# Patient Record
Sex: Female | Born: 1960 | Race: White | Hispanic: No | Marital: Married | Smoking: Never smoker
Health system: Southern US, Community
[De-identification: ages and names within clinical notes are randomized; demographics above are authoritative.]

## PROBLEM LIST (undated history)

## (undated) DIAGNOSIS — R5383 Other fatigue: Secondary | ICD-10-CM

## (undated) DIAGNOSIS — M797 Fibromyalgia: Secondary | ICD-10-CM

## (undated) DIAGNOSIS — G4700 Insomnia, unspecified: Secondary | ICD-10-CM

## (undated) DIAGNOSIS — M26649 Arthritis of unspecified temporomandibular joint: Secondary | ICD-10-CM

## (undated) DIAGNOSIS — N390 Urinary tract infection, site not specified: Secondary | ICD-10-CM

## (undated) DIAGNOSIS — N63 Unspecified lump in unspecified breast: Secondary | ICD-10-CM

## (undated) DIAGNOSIS — Z8041 Family history of malignant neoplasm of ovary: Secondary | ICD-10-CM

## (undated) DIAGNOSIS — J329 Chronic sinusitis, unspecified: Secondary | ICD-10-CM

## (undated) DIAGNOSIS — E559 Vitamin D deficiency, unspecified: Secondary | ICD-10-CM

## (undated) DIAGNOSIS — R635 Abnormal weight gain: Secondary | ICD-10-CM

## (undated) DIAGNOSIS — R519 Headache, unspecified: Secondary | ICD-10-CM

## (undated) DIAGNOSIS — R350 Frequency of micturition: Secondary | ICD-10-CM

## (undated) DIAGNOSIS — M1990 Unspecified osteoarthritis, unspecified site: Secondary | ICD-10-CM

## (undated) DIAGNOSIS — R0683 Snoring: Secondary | ICD-10-CM

## (undated) DIAGNOSIS — F32A Depression, unspecified: Secondary | ICD-10-CM

## (undated) DIAGNOSIS — M7060 Trochanteric bursitis, unspecified hip: Secondary | ICD-10-CM

## (undated) DIAGNOSIS — N926 Irregular menstruation, unspecified: Secondary | ICD-10-CM

## (undated) DIAGNOSIS — R102 Pelvic and perineal pain: Secondary | ICD-10-CM

## (undated) DIAGNOSIS — H6120 Impacted cerumen, unspecified ear: Secondary | ICD-10-CM

## (undated) DIAGNOSIS — F4322 Adjustment disorder with anxiety: Secondary | ICD-10-CM

## (undated) DIAGNOSIS — G43909 Migraine, unspecified, not intractable, without status migrainosus: Secondary | ICD-10-CM

## (undated) DIAGNOSIS — Z1371 Encounter for nonprocreative screening for genetic disease carrier status: Secondary | ICD-10-CM

## (undated) DIAGNOSIS — F329 Major depressive disorder, single episode, unspecified: Secondary | ICD-10-CM

## (undated) DIAGNOSIS — R51 Headache: Secondary | ICD-10-CM

## (undated) DIAGNOSIS — M549 Dorsalgia, unspecified: Secondary | ICD-10-CM

## (undated) DIAGNOSIS — R21 Rash and other nonspecific skin eruption: Secondary | ICD-10-CM

## (undated) DIAGNOSIS — N6019 Diffuse cystic mastopathy of unspecified breast: Secondary | ICD-10-CM

## (undated) DIAGNOSIS — M2669 Other specified disorders of temporomandibular joint: Secondary | ICD-10-CM

## (undated) DIAGNOSIS — E282 Polycystic ovarian syndrome: Secondary | ICD-10-CM

## (undated) HISTORY — DX: Migraine, unspecified, not intractable, without status migrainosus: G43.909

## (undated) HISTORY — DX: Depression, unspecified: F32.A

## (undated) HISTORY — DX: Unspecified osteoarthritis, unspecified site: M19.90

## (undated) HISTORY — DX: Arthritis of unspecified temporomandibular joint: M26.649

## (undated) HISTORY — DX: Trochanteric bursitis, unspecified hip: M70.60

## (undated) HISTORY — DX: Headache, unspecified: R51.9

## (undated) HISTORY — DX: Frequency of micturition: R35.0

## (undated) HISTORY — DX: Urinary tract infection, site not specified: N39.0

## (undated) HISTORY — DX: Other specified disorders of temporomandibular joint: M26.69

## (undated) HISTORY — DX: Pelvic and perineal pain: R10.2

## (undated) HISTORY — DX: Chronic sinusitis, unspecified: J32.9

## (undated) HISTORY — DX: Polycystic ovarian syndrome: E28.2

## (undated) HISTORY — DX: Unspecified lump in unspecified breast: N63.0

## (undated) HISTORY — DX: Snoring: R06.83

## (undated) HISTORY — DX: Encounter for nonprocreative screening for genetic disease carrier status: Z13.71

## (undated) HISTORY — DX: Rash and other nonspecific skin eruption: R21

## (undated) HISTORY — DX: Dorsalgia, unspecified: M54.9

## (undated) HISTORY — DX: Adjustment disorder with anxiety: F43.22

## (undated) HISTORY — DX: Family history of malignant neoplasm of ovary: Z80.41

## (undated) HISTORY — DX: Headache: R51

## (undated) HISTORY — DX: Insomnia, unspecified: G47.00

## (undated) HISTORY — DX: Vitamin D deficiency, unspecified: E55.9

## (undated) HISTORY — DX: Irregular menstruation, unspecified: N92.6

## (undated) HISTORY — DX: Major depressive disorder, single episode, unspecified: F32.9

## (undated) HISTORY — DX: Fibromyalgia: M79.7

## (undated) HISTORY — DX: Abnormal weight gain: R63.5

## (undated) HISTORY — DX: Other fatigue: R53.83

## (undated) HISTORY — DX: Diffuse cystic mastopathy of unspecified breast: N60.19

## (undated) HISTORY — DX: Impacted cerumen, unspecified ear: H61.20

---

## 2004-06-19 ENCOUNTER — Ambulatory Visit: Payer: Self-pay

## 2005-07-22 ENCOUNTER — Ambulatory Visit: Payer: Self-pay

## 2005-08-07 ENCOUNTER — Ambulatory Visit: Payer: Self-pay

## 2006-09-04 ENCOUNTER — Ambulatory Visit: Payer: Self-pay

## 2007-08-20 HISTORY — PX: SHOULDER SURGERY: SHX246

## 2007-09-11 ENCOUNTER — Ambulatory Visit: Payer: Self-pay

## 2007-09-16 ENCOUNTER — Ambulatory Visit: Payer: Self-pay | Admitting: Specialist

## 2007-09-23 ENCOUNTER — Ambulatory Visit: Payer: Self-pay | Admitting: Specialist

## 2008-03-08 ENCOUNTER — Ambulatory Visit: Payer: Self-pay | Admitting: Specialist

## 2008-09-28 ENCOUNTER — Ambulatory Visit: Payer: Self-pay | Admitting: Family Medicine

## 2009-11-27 ENCOUNTER — Ambulatory Visit: Payer: Self-pay

## 2009-12-25 ENCOUNTER — Ambulatory Visit: Payer: Self-pay | Admitting: Gastroenterology

## 2010-08-19 HISTORY — PX: OTHER SURGICAL HISTORY: SHX169

## 2010-12-13 ENCOUNTER — Ambulatory Visit: Payer: Self-pay | Admitting: Specialist

## 2011-01-03 ENCOUNTER — Ambulatory Visit: Payer: Self-pay | Admitting: Family Medicine

## 2011-03-13 ENCOUNTER — Other Ambulatory Visit: Payer: Self-pay | Admitting: Neurosurgery

## 2011-03-13 ENCOUNTER — Ambulatory Visit
Admission: RE | Admit: 2011-03-13 | Discharge: 2011-03-13 | Disposition: A | Discharge status: Home / Self Care | Payer: 59 | Source: Ambulatory Visit | Attending: Neurosurgery | Admitting: Neurosurgery

## 2011-03-13 DIAGNOSIS — M542 Cervicalgia: Secondary | ICD-10-CM

## 2011-05-31 ENCOUNTER — Ambulatory Visit: Payer: Self-pay | Admitting: Specialist

## 2011-07-24 ENCOUNTER — Ambulatory Visit: Payer: Self-pay

## 2012-06-25 ENCOUNTER — Ambulatory Visit: Payer: Self-pay | Admitting: Family Medicine

## 2013-01-12 ENCOUNTER — Other Ambulatory Visit: Payer: Self-pay | Admitting: Neurosurgery

## 2013-01-12 DIAGNOSIS — M5412 Radiculopathy, cervical region: Secondary | ICD-10-CM

## 2013-01-18 ENCOUNTER — Ambulatory Visit
Admission: RE | Admit: 2013-01-18 | Discharge: 2013-01-18 | Disposition: A | Discharge status: Home / Self Care | Payer: 59 | Source: Ambulatory Visit | Attending: Neurosurgery | Admitting: Neurosurgery

## 2013-01-18 DIAGNOSIS — M5412 Radiculopathy, cervical region: Secondary | ICD-10-CM

## 2013-02-10 ENCOUNTER — Other Ambulatory Visit: Payer: Self-pay | Admitting: Neurosurgery

## 2013-02-10 DIAGNOSIS — M5020 Other cervical disc displacement, unspecified cervical region: Secondary | ICD-10-CM

## 2013-02-12 ENCOUNTER — Ambulatory Visit
Admission: RE | Admit: 2013-02-12 | Discharge: 2013-02-12 | Disposition: A | Discharge status: Home / Self Care | Payer: 59 | Source: Ambulatory Visit | Attending: Neurosurgery | Admitting: Neurosurgery

## 2013-02-12 VITALS — BP 118/63 | HR 77

## 2013-02-12 DIAGNOSIS — M5020 Other cervical disc displacement, unspecified cervical region: Secondary | ICD-10-CM

## 2013-02-12 MED ORDER — IOHEXOL 300 MG/ML  SOLN
1.0000 mL | Freq: Once | INTRAMUSCULAR | Status: AC | PRN
  Administered 2013-02-12: 1 mL via EPIDURAL

## 2013-02-12 MED ORDER — TRIAMCINOLONE ACETONIDE 40 MG/ML IJ SUSP (RADIOLOGY)
60.0000 mg | Freq: Once | INTRAMUSCULAR | Status: AC
  Administered 2013-02-12: 60 mg via EPIDURAL

## 2013-07-06 ENCOUNTER — Ambulatory Visit: Payer: Self-pay | Admitting: Family Medicine

## 2013-07-23 ENCOUNTER — Telehealth: Payer: Self-pay | Admitting: Obstetrics & Gynecology

## 2013-07-28 ENCOUNTER — Ambulatory Visit: Payer: Self-pay | Admitting: Family Medicine

## 2014-06-03 ENCOUNTER — Other Ambulatory Visit: Payer: Self-pay

## 2014-09-22 ENCOUNTER — Ambulatory Visit: Payer: Self-pay | Admitting: Specialist

## 2014-11-10 DIAGNOSIS — Z8739 Personal history of other diseases of the musculoskeletal system and connective tissue: Secondary | ICD-10-CM | POA: Insufficient documentation

## 2015-02-22 ENCOUNTER — Other Ambulatory Visit: Payer: Self-pay

## 2015-02-22 DIAGNOSIS — M549 Dorsalgia, unspecified: Secondary | ICD-10-CM | POA: Insufficient documentation

## 2015-02-22 MED ORDER — MELOXICAM 15 MG PO TABS: 15.0000 mg | ORAL_TABLET | Freq: Every day | ORAL | Status: DC

## 2015-05-17 ENCOUNTER — Encounter: Payer: Self-pay | Admitting: *Deleted

## 2015-05-22 ENCOUNTER — Encounter: Payer: Self-pay | Admitting: Urology

## 2015-05-22 ENCOUNTER — Ambulatory Visit (INDEPENDENT_AMBULATORY_CARE_PROVIDER_SITE_OTHER): Payer: 59 | Admitting: Urology

## 2015-05-22 VITALS — BP 101/69 | HR 97 | Ht 64.0 in | Wt 180.2 lb

## 2015-05-22 DIAGNOSIS — N3946 Mixed incontinence: Secondary | ICD-10-CM

## 2015-05-22 DIAGNOSIS — R350 Frequency of micturition: Secondary | ICD-10-CM

## 2015-05-22 DIAGNOSIS — N390 Urinary tract infection, site not specified: Secondary | ICD-10-CM

## 2015-05-22 LAB — MICROSCOPIC EXAMINATION: RBC, UA: NONE SEEN /hpf (ref 0–?)

## 2015-05-22 LAB — URINALYSIS, COMPLETE
Bilirubin, UA: NEGATIVE
GLUCOSE, UA: NEGATIVE
LEUKOCYTES UA: NEGATIVE
NITRITE UA: NEGATIVE
Protein, UA: NEGATIVE
RBC, UA: NEGATIVE
Urobilinogen, Ur: 0.2 mg/dL (ref 0.2–1.0)
pH, UA: 5.5 (ref 5.0–7.5)

## 2015-05-22 LAB — BLADDER SCAN AMB NON-IMAGING: Scan Result: 0

## 2015-05-22 MED ORDER — ESTROGENS, CONJUGATED 0.625 MG/GM VA CREA: 1.0000 | TOPICAL_CREAM | Freq: Every day | VAGINAL | Status: DC

## 2015-05-22 MED ORDER — MIRABEGRON ER 25 MG PO TB24: 25.0000 mg | ORAL_TABLET | Freq: Every day | ORAL | Status: DC

## 2015-05-22 NOTE — Progress Notes (Signed)
05/22/2015 9:37 AM   Kayla English August 02, 1961 [id number]  Referring provider: No referring provider defined for this encounter.  Chief Complaint  Patient presents with  . Urinary Frequency    one year recheck  . Urinary Tract Infection    HPI: Patient is a 54 year old white female with a history of recurrent urinary tract infections, mixed incontinence and urinary frequency who presents today for her yearly follow-up.  Today, patient is complaining of urinary urgency, frequency and pain at the end of urination.  She states she feels like something "plugs" her up and then she finishes voiding.  She does not complain of dysuria, gross hematuria or suprapubic pain.    She is also having mixed urinary incontinence.  She also complains of dyspareunia of a frictional nature.    She was on Myrbetriq 25 mg daily 1 year ago and she states she did help with the urgency and frequency, but she cannot remember if it controlled the pain at the end of urination.  She states she only took the Myrbetriq for 2 months, but she did not call for a refill.  PMH: Past Medical History  Diagnosis Date  . Trochanteric bursitis   . Pelvic pain in female   . Weight gain   . Fatigue   . Sinusitis   . Insomnia   . Headache   . Avitaminosis D   . TMJ arthritis   . Snoring   . Rash   . Transient adjustment reaction with anxiety   . Cerumen impaction   . Migraine   . Polycystic ovaries   . Breast nodule   . Back pain   . Irregular menses   . Osteoarthritis   . Depression   . Fibromyalgia   . Fibrocystic breast   . Pelvic pain in female   . Urinary frequency   . UTI (lower urinary tract infection)     Surgical History: Past Surgical History  Procedure Laterality Date  . Neck disc sugery  2012  . Shoulder surgery  2009  . Cesarean section  1977    Home Medications:    Medication List       This list is accurate as of: 05/22/15  9:37 AM.  Always use your most recent med list.               acetaminophen 325 MG tablet  Commonly known as:  TYLENOL  Take by mouth.     buPROPion 150 MG 24 hr tablet  Commonly known as:  WELLBUTRIN XL  Take by mouth.     calcium-vitamin D 500-200 MG-UNIT tablet  Take by mouth.     conjugated estrogens vaginal cream  Commonly known as:  PREMARIN  Place 1 Applicatorful vaginally daily. Apply 0.5mg  (pea-sized amount)  just inside the vaginal introitus with a finger-tip every night for two weeks and then Monday, Wednesday and Friday nights.     DULoxetine 60 MG capsule  Commonly known as:  CYMBALTA  Take by mouth.     ibuprofen 400 MG tablet  Commonly known as:  ADVIL,MOTRIN  Take 400 mg by mouth every 6 (six) hours as needed.     isometheptene-acetaminophen-dichloralphenazone 65-100-325 MG capsule  Commonly known as:  MIDRIN  Take by mouth.     meloxicam 15 MG tablet  Commonly known as:  MOBIC  Take 1 tablet (15 mg total) by mouth daily. as needed     mirabegron ER 25 MG Tb24 tablet  Commonly known as:  MYRBETRIQ  Take 1 tablet (25 mg total) by mouth daily.     MULTI-VITAMINS Tabs  Take by mouth.     PROBIOTIC & ACIDOPHILUS EX ST PO  Take by mouth.     sertraline 100 MG tablet  Commonly known as:  ZOLOFT     topiramate 100 MG tablet  Commonly known as:  TOPAMAX  Take by mouth.     Vitamin D (Cholecalciferol) 1000 UNITS Caps  Take by mouth.        Allergies: No Known Allergies  Family History: Family History  Problem Relation Age of Onset  . Kidney disease Neg Hx   . Bladder Cancer Neg Hx   . Prostate cancer Neg Hx     Social History:  reports that she has never smoked. She does not have any smokeless tobacco history on file. She reports that she does not drink alcohol or use illicit drugs.  ROS: UROLOGY Frequent Urination?: No Hard to postpone urination?: Yes Burning/pain with urination?: No Get up at night to urinate?: No Leakage of urine?: Yes Urine stream starts and stops?: No Trouble  starting stream?: No Do you have to strain to urinate?: No Blood in urine?: No Urinary tract infection?: No Sexually transmitted disease?: No Injury to kidneys or bladder?: No Painful intercourse?: Yes Weak stream?: No Currently pregnant?: No Vaginal bleeding?: No Last menstrual period?: n  Gastrointestinal Nausea?: No Vomiting?: No Indigestion/heartburn?: No Diarrhea?: No Constipation?: No  Constitutional Fever: No Night sweats?: No Weight loss?: No Fatigue?: Yes  Skin Skin rash/lesions?: No Itching?: No  Eyes Blurred vision?: Yes Double vision?: No  Ears/Nose/Throat Sore throat?: No Sinus problems?: No  Hematologic/Lymphatic Swollen glands?: No Easy bruising?: Yes  Cardiovascular Leg swelling?: No Chest pain?: No  Respiratory Cough?: No Shortness of breath?: No  Endocrine Excessive thirst?: No  Musculoskeletal Back pain?: Yes Joint pain?: Yes  Neurological Headaches?: Yes Dizziness?: Yes  Psychologic Depression?: No Anxiety?: Yes  Physical Exam: BP 101/69 mmHg  Pulse 97  Ht  (1.626 m)  Wt 180 lb 3.2 oz (81.738 kg)  BMI 30.92 kg/m2  GU:  Atrophic external genitalia.  Normal urethral meatus. No urethral masses and/or tenderness. Urethral hypermobility noted, but no leakage demonstrated.  No bladder fullness or masses. Grade I cystocele.  No vaginal lesions or discharge. Normal rectal tone, no masses. Normal anus and perineum.   Laboratory Data:  Urinalysis Results for orders placed or performed in visit on 05/22/15  Microscopic Examination  Result Value Ref Range   WBC, UA 6-10 (A) 0 -  5 /hpf   RBC, UA None seen 0 -  2 /hpf   Epithelial Cells (non renal) 0-10 0 - 10 /hpf   Mucus, UA Present (A) Not Estab.   Bacteria, UA Many (A) None seen/Few  Urinalysis, Complete  Result Value Ref Range   Specific Gravity, UA >1.030 (H) 1.005 - 1.030   pH, UA 5.5 5.0 - 7.5   Color, UA Yellow Yellow   Appearance Ur Clear Clear    Leukocytes, UA Negative Negative   Protein, UA Negative Negative/Trace   Glucose, UA Negative Negative   Ketones, UA Trace (A) Negative   RBC, UA Negative Negative   Bilirubin, UA Negative Negative   Urobilinogen, Ur 0.2 0.2 - 1.0 mg/dL   Nitrite, UA Negative Negative   Microscopic Examination See below:   BLADDER SCAN AMB NON-IMAGING  Result Value Ref Range   Scan Result 0      Pertinent Imaging: Results  for LEIGHANN, AMADON (MRN [id number]) as of 05/22/2015 09:02  Ref. Range 05/22/2015 08:57  Scan Result Unknown 0    Assessment & Plan:    1. Mixed incontinence:   Patient is unsure if the Myrbetriq was helpful with her mixed urinary incontinence.  She would like to try the medication again. I have given her Myrbetriq 25 mg samples (#28) and will have her return in 3 weeks for a symptom recheck and PVR.  2. Urinary frequency:   Patient found  Myrbetriq  25 mg helpful with the urinary frequency. She would like to restart this medication.   She will return in 3 weeks for symptom recheck and PVR.  3. Recurrent UTI:    Patient does not report any urinary tract infections over the last year.  We will continue to monitor.  4. Atrophic vaginitis:   Patient was given a sample of vaginal estrogen cream (PREMARIN) and instructed to apply 0.5mg  (pea-sized amount)  just inside the vaginal introitus with a finger-tip every night for two weeks and then Monday, Wednesday and Friday nights.  I explained to the patient that vaginally administered estrogen, which causes only a slight increase in the blood estrogen levels, have fewer contraindications and adverse systemic effects that oral HT.   We will recheck her vaginal mucosa and see if her dyspareunia improves.    Return in about 3 weeks (around 06/12/2015) for PVR.  Michiel Cowboy, PA-C  [facility] [address] [place], [place] [address] [phone]

## 2015-05-22 NOTE — Patient Instructions (Signed)
Patient was given a sample of vaginal estrogen cream and instructed to apply 0.5mg (pea-sized amount)  just inside the vaginal introitus with a finger-tip every night for two weeks and then Monday, Wednesday and Friday nights.   

## 2015-05-31 ENCOUNTER — Telehealth: Payer: Self-pay

## 2015-05-31 NOTE — Telephone Encounter (Signed)
Myrbetriq approved via PA.

## 2015-06-01 ENCOUNTER — Other Ambulatory Visit: Payer: Self-pay

## 2015-06-01 DIAGNOSIS — F329 Major depressive disorder, single episode, unspecified: Secondary | ICD-10-CM

## 2015-06-01 DIAGNOSIS — F32A Depression, unspecified: Secondary | ICD-10-CM

## 2015-06-01 MED ORDER — SERTRALINE HCL 100 MG PO TABS: 100.0000 mg | ORAL_TABLET | Freq: Every day | ORAL | Status: DC

## 2015-06-01 NOTE — Telephone Encounter (Signed)
Pharmacy requesting refill. Last OV was 08/02/14.

## 2015-06-13 ENCOUNTER — Ambulatory Visit: Payer: 59 | Admitting: Urology

## 2015-10-04 ENCOUNTER — Other Ambulatory Visit: Payer: Self-pay

## 2015-10-04 DIAGNOSIS — F32A Depression, unspecified: Secondary | ICD-10-CM | POA: Insufficient documentation

## 2015-10-04 DIAGNOSIS — F329 Major depressive disorder, single episode, unspecified: Secondary | ICD-10-CM

## 2015-10-04 MED ORDER — DULOXETINE HCL 60 MG PO CPEP: 60.0000 mg | ORAL_CAPSULE | Freq: Every day | ORAL | Status: DC

## 2015-10-04 NOTE — Telephone Encounter (Signed)
Pharmacy requesting refill. sd 

## 2015-12-18 ENCOUNTER — Other Ambulatory Visit: Payer: Self-pay | Admitting: Family Medicine

## 2015-12-18 DIAGNOSIS — F32A Depression, unspecified: Secondary | ICD-10-CM

## 2015-12-18 DIAGNOSIS — F329 Major depressive disorder, single episode, unspecified: Secondary | ICD-10-CM

## 2016-01-29 ENCOUNTER — Encounter: Payer: Self-pay | Admitting: Podiatry

## 2016-01-29 ENCOUNTER — Ambulatory Visit (INDEPENDENT_AMBULATORY_CARE_PROVIDER_SITE_OTHER): Payer: 59 | Admitting: Podiatry

## 2016-01-29 VITALS — BP 117/73 | HR 77 | Resp 16

## 2016-01-29 DIAGNOSIS — H6120 Impacted cerumen, unspecified ear: Secondary | ICD-10-CM | POA: Insufficient documentation

## 2016-01-29 DIAGNOSIS — N926 Irregular menstruation, unspecified: Secondary | ICD-10-CM | POA: Insufficient documentation

## 2016-01-29 DIAGNOSIS — Z1211 Encounter for screening for malignant neoplasm of colon: Secondary | ICD-10-CM | POA: Insufficient documentation

## 2016-01-29 DIAGNOSIS — N760 Acute vaginitis: Secondary | ICD-10-CM

## 2016-01-29 DIAGNOSIS — B373 Candidiasis of vulva and vagina: Secondary | ICD-10-CM | POA: Insufficient documentation

## 2016-01-29 DIAGNOSIS — N63 Unspecified lump in unspecified breast: Secondary | ICD-10-CM | POA: Insufficient documentation

## 2016-01-29 DIAGNOSIS — Z1239 Encounter for other screening for malignant neoplasm of breast: Secondary | ICD-10-CM | POA: Insufficient documentation

## 2016-01-29 DIAGNOSIS — F419 Anxiety disorder, unspecified: Secondary | ICD-10-CM | POA: Insufficient documentation

## 2016-01-29 DIAGNOSIS — Z6828 Body mass index (BMI) 28.0-28.9, adult: Secondary | ICD-10-CM | POA: Insufficient documentation

## 2016-01-29 DIAGNOSIS — M1990 Unspecified osteoarthritis, unspecified site: Secondary | ICD-10-CM | POA: Insufficient documentation

## 2016-01-29 DIAGNOSIS — Q828 Other specified congenital malformations of skin: Secondary | ICD-10-CM | POA: Diagnosis not present

## 2016-01-29 DIAGNOSIS — M7060 Trochanteric bursitis, unspecified hip: Secondary | ICD-10-CM | POA: Insufficient documentation

## 2016-01-29 DIAGNOSIS — R635 Abnormal weight gain: Secondary | ICD-10-CM | POA: Insufficient documentation

## 2016-01-29 DIAGNOSIS — G4700 Insomnia, unspecified: Secondary | ICD-10-CM | POA: Insufficient documentation

## 2016-01-29 DIAGNOSIS — M26649 Arthritis of unspecified temporomandibular joint: Secondary | ICD-10-CM | POA: Insufficient documentation

## 2016-01-29 DIAGNOSIS — R0683 Snoring: Secondary | ICD-10-CM | POA: Insufficient documentation

## 2016-01-29 DIAGNOSIS — Z9229 Personal history of other drug therapy: Secondary | ICD-10-CM | POA: Insufficient documentation

## 2016-01-29 DIAGNOSIS — R51 Headache: Secondary | ICD-10-CM

## 2016-01-29 DIAGNOSIS — N390 Urinary tract infection, site not specified: Secondary | ICD-10-CM | POA: Insufficient documentation

## 2016-01-29 DIAGNOSIS — Z23 Encounter for immunization: Secondary | ICD-10-CM | POA: Insufficient documentation

## 2016-01-29 DIAGNOSIS — Z8619 Personal history of other infectious and parasitic diseases: Secondary | ICD-10-CM | POA: Insufficient documentation

## 2016-01-29 DIAGNOSIS — N6019 Diffuse cystic mastopathy of unspecified breast: Secondary | ICD-10-CM | POA: Insufficient documentation

## 2016-01-29 DIAGNOSIS — E559 Vitamin D deficiency, unspecified: Secondary | ICD-10-CM | POA: Insufficient documentation

## 2016-01-29 DIAGNOSIS — G43909 Migraine, unspecified, not intractable, without status migrainosus: Secondary | ICD-10-CM | POA: Insufficient documentation

## 2016-01-29 DIAGNOSIS — E282 Polycystic ovarian syndrome: Secondary | ICD-10-CM | POA: Insufficient documentation

## 2016-01-29 DIAGNOSIS — R319 Hematuria, unspecified: Secondary | ICD-10-CM | POA: Insufficient documentation

## 2016-01-29 DIAGNOSIS — R102 Pelvic and perineal pain: Secondary | ICD-10-CM | POA: Insufficient documentation

## 2016-01-29 DIAGNOSIS — M797 Fibromyalgia: Secondary | ICD-10-CM | POA: Insufficient documentation

## 2016-01-29 DIAGNOSIS — B9689 Other specified bacterial agents as the cause of diseases classified elsewhere: Secondary | ICD-10-CM | POA: Insufficient documentation

## 2016-01-29 DIAGNOSIS — N951 Menopausal and female climacteric states: Secondary | ICD-10-CM | POA: Insufficient documentation

## 2016-01-29 DIAGNOSIS — R21 Rash and other nonspecific skin eruption: Secondary | ICD-10-CM | POA: Insufficient documentation

## 2016-01-29 DIAGNOSIS — R519 Headache, unspecified: Secondary | ICD-10-CM | POA: Insufficient documentation

## 2016-01-29 DIAGNOSIS — R42 Dizziness and giddiness: Secondary | ICD-10-CM | POA: Insufficient documentation

## 2016-01-29 DIAGNOSIS — Z124 Encounter for screening for malignant neoplasm of cervix: Secondary | ICD-10-CM | POA: Insufficient documentation

## 2016-01-29 DIAGNOSIS — J329 Chronic sinusitis, unspecified: Secondary | ICD-10-CM | POA: Insufficient documentation

## 2016-01-29 DIAGNOSIS — R5383 Other fatigue: Secondary | ICD-10-CM | POA: Insufficient documentation

## 2016-01-29 DIAGNOSIS — B3731 Acute candidiasis of vulva and vagina: Secondary | ICD-10-CM | POA: Insufficient documentation

## 2016-01-29 DIAGNOSIS — G4733 Obstructive sleep apnea (adult) (pediatric): Secondary | ICD-10-CM | POA: Insufficient documentation

## 2016-01-29 DIAGNOSIS — M2669 Other specified disorders of temporomandibular joint: Secondary | ICD-10-CM

## 2016-01-29 DIAGNOSIS — Z0000 Encounter for general adult medical examination without abnormal findings: Secondary | ICD-10-CM | POA: Insufficient documentation

## 2016-01-29 DIAGNOSIS — F4322 Adjustment disorder with anxiety: Secondary | ICD-10-CM | POA: Insufficient documentation

## 2016-01-29 NOTE — Progress Notes (Signed)
   Subjective:    Patient ID: Kayla English, female    DOB: 1961-01-03, 55 y.o.   MRN: [id number]  HPI: She presents today with a chief complaint of a painful wart to the plantar aspect of her second metatarsal head left foot. States that she has tried all of the over-the-counter preparations to get rid of the wart but it is recalcitrant.    Review of Systems  Constitutional: Positive for fatigue.  Eyes: Positive for visual disturbance.  Musculoskeletal: Positive for myalgias, back pain and arthralgias.  Neurological: Positive for dizziness and headaches.  All other systems reviewed and are negative.      Objective:   Physical Exam: Vital signs stable alert 3 pulses are strongly palpable. Neurologic sensorium is intact. Deep tendon reflexes are intact. Muscle strength is 5 over 5 dorsiflexion plantar flexors and inverters and everters on physical musculatures intact. Orthopedic evaluation and strips all joints distal to the ankle for range of motion without crepitation. Cutaneous evaluation of a straight supple well-hydrated cutis no erythema edema saline as drainage or odor. Cutaneous evaluation and stress a solitary porokeratotic lesion subsecond metatarsal of the left foot. Upon evaluation and skin debridement it does not demonstrate any typical signs of a wart. Moreover it does demonstrate signs of a solitary porokeratotic lesion. No open lesions or wounds are noted today.        Assessment & Plan:  Porokeratosis subsecond metatarsophalangeal joint of the left foot.  Plan: Mechanical and chemical debridement of the lesion under occlusion today. Follow-up with me as needed. She was given oral and written home going instructions for care of her procedure.

## 2016-02-06 ENCOUNTER — Other Ambulatory Visit: Payer: Self-pay | Admitting: Family Medicine

## 2016-02-06 DIAGNOSIS — F329 Major depressive disorder, single episode, unspecified: Secondary | ICD-10-CM

## 2016-02-06 DIAGNOSIS — F32A Depression, unspecified: Secondary | ICD-10-CM

## 2016-03-04 ENCOUNTER — Encounter: Payer: Self-pay | Admitting: Physician Assistant

## 2016-03-04 ENCOUNTER — Ambulatory Visit (INDEPENDENT_AMBULATORY_CARE_PROVIDER_SITE_OTHER): Payer: 59 | Admitting: Physician Assistant

## 2016-03-04 VITALS — BP 112/68 | HR 80 | Temp 98.7°F | Resp 16 | Ht 64.5 in | Wt 187.0 lb

## 2016-03-04 DIAGNOSIS — Z13220 Encounter for screening for lipoid disorders: Secondary | ICD-10-CM | POA: Diagnosis not present

## 2016-03-04 DIAGNOSIS — Z136 Encounter for screening for cardiovascular disorders: Secondary | ICD-10-CM | POA: Diagnosis not present

## 2016-03-04 DIAGNOSIS — Z1239 Encounter for other screening for malignant neoplasm of breast: Secondary | ICD-10-CM

## 2016-03-04 DIAGNOSIS — F329 Major depressive disorder, single episode, unspecified: Secondary | ICD-10-CM

## 2016-03-04 DIAGNOSIS — Z0000 Encounter for general adult medical examination without abnormal findings: Secondary | ICD-10-CM

## 2016-03-04 DIAGNOSIS — F32A Depression, unspecified: Secondary | ICD-10-CM

## 2016-03-04 MED ORDER — DULOXETINE HCL 60 MG PO CPEP: 60.0000 mg | ORAL_CAPSULE | Freq: Every day | ORAL | Status: DC

## 2016-03-04 NOTE — Patient Instructions (Signed)
Health Maintenance, Female Adopting a healthy lifestyle and getting preventive care can go a long way to promote health and wellness. Talk with your health care provider about what schedule of regular examinations is right for you. This is a good chance for you to check in with your provider about disease prevention and staying healthy. In between checkups, there are plenty of things you can do on your own. Experts have done a lot of research about which lifestyle changes and preventive measures are most likely to keep you healthy. Ask your health care provider for more information. WEIGHT AND DIET  Eat a healthy diet  Be sure to include plenty of vegetables, fruits, low-fat dairy products, and lean protein.  Do not eat a lot of foods high in solid fats, added sugars, or salt.  Get regular exercise. This is one of the most important things you can do for your health.  Most adults should exercise for at least 150 minutes each week. The exercise should increase your heart rate and make you sweat (moderate-intensity exercise).  Most adults should also do strengthening exercises at least twice a week. This is in addition to the moderate-intensity exercise.  Maintain a healthy weight  Body mass index (BMI) is a measurement that can be used to identify possible weight problems. It estimates body fat based on height and weight. Your health care provider can help determine your BMI and help you achieve or maintain a healthy weight.  For females 57 years of age and older:   A BMI below 18.5 is considered underweight.  A BMI of 18.5 to 24.9 is normal.  A BMI of 25 to 29.9 is considered overweight.  A BMI of 30 and above is considered obese.  Watch levels of cholesterol and blood lipids  You should start having your blood tested for lipids and cholesterol at 57 years of age, then have this test every 5 years.  You may need to have your cholesterol levels checked more often if:  Your lipid  or cholesterol levels are high.  You are older than 57 years of age.  You are at high risk for heart disease.  CANCER SCREENING   Lung Cancer  Lung cancer screening is recommended for adults 57-66 years old who are at high risk for lung cancer because of a history of smoking.  A yearly low-dose CT scan of the lungs is recommended for people who:  Currently smoke.  Have quit within the past 15 years.  Have at least a 30-pack-year history of smoking. A pack year is smoking an average of one pack of cigarettes a day for 1 year.  Yearly screening should continue until it has been 15 years since you quit.  Yearly screening should stop if you develop a health problem that would prevent you from having lung cancer treatment.  Breast Cancer  Practice breast self-awareness. This means understanding how your breasts normally appear and feel.  It also means doing regular breast self-exams. Let your health care provider know about any changes, no matter how small.  If you are in your 20s or 30s, you should have a clinical breast exam (CBE) by a health care provider every 1-3 years as part of a regular health exam.  If you are 25 or older, have a CBE every year. Also consider having a breast X-ray (mammogram) every year.  If you have a family history of breast cancer, talk to your health care provider about genetic screening.  If you  are at high risk for breast cancer, talk to your health care provider about having an MRI and a mammogram every year.  Breast cancer gene (BRCA) assessment is recommended for women who have family members with BRCA-related cancers. BRCA-related cancers include:  Breast.  Ovarian.  Tubal.  Peritoneal cancers.  Results of the assessment will determine the need for genetic counseling and BRCA1 and BRCA2 testing. Cervical Cancer Your health care provider may recommend that you be screened regularly for cancer of the pelvic organs (ovaries, uterus, and  vagina). This screening involves a pelvic examination, including checking for microscopic changes to the surface of your cervix (Pap test). You may be encouraged to have this screening done every 3 years, beginning at age 57.  For women ages 30-65, health care providers may recommend pelvic exams and Pap testing every 3 years, or they may recommend the Pap and pelvic exam, combined with testing for human papilloma virus (HPV), every 5 years. Some types of HPV increase your risk of cervical cancer. Testing for HPV may also be done on women of any age with unclear Pap test results.  Other health care providers may not recommend any screening for nonpregnant women who are considered low risk for pelvic cancer and who do not have symptoms. Ask your health care provider if a screening pelvic exam is right for you.  If you have had past treatment for cervical cancer or a condition that could lead to cancer, you need Pap tests and screening for cancer for at least 20 years after your treatment. If Pap tests have been discontinued, your risk factors (such as having a new sexual partner) need to be reassessed to determine if screening should resume. Some women have medical problems that increase the chance of getting cervical cancer. In these cases, your health care provider may recommend more frequent screening and Pap tests. Colorectal Cancer  This type of cancer can be detected and often prevented.  Routine colorectal cancer screening usually begins at 57 years of age and continues through 57 years of age.  Your health care provider may recommend screening at an earlier age if you have risk factors for colon cancer.  Your health care provider may also recommend using home test kits to check for hidden blood in the stool.  A small camera at the end of a tube can be used to examine your colon directly (sigmoidoscopy or colonoscopy). This is done to check for the earliest forms of colorectal  cancer.  Routine screening usually begins at age 50.  Direct examination of the colon should be repeated every 5-10 years through 57 years of age. However, you may need to be screened more often if early forms of precancerous polyps or small growths are found. Skin Cancer  Check your skin from head to toe regularly.  Tell your health care provider about any new moles or changes in moles, especially if there is a change in a mole's shape or color.  Also tell your health care provider if you have a mole that is larger than the size of a pencil eraser.  Always use sunscreen. Apply sunscreen liberally and repeatedly throughout the day.  Protect yourself by wearing long sleeves, pants, a wide-brimmed hat, and sunglasses whenever you are outside. HEART DISEASE, DIABETES, AND HIGH BLOOD PRESSURE   High blood pressure causes heart disease and increases the risk of stroke. High blood pressure is more likely to develop in:  People who have blood pressure in the high end   of the normal range (130-139/85-89 mm Hg).  People who are overweight or obese.  People who are African American.  If you are 38-23 years of age, have your blood pressure checked every 3-5 years. If you are 61 years of age or older, have your blood pressure checked every year. You should have your blood pressure measured twice--once when you are at a hospital or clinic, and once when you are not at a hospital or clinic. Record the average of the two measurements. To check your blood pressure when you are not at a hospital or clinic, you can use:  An automated blood pressure machine at a pharmacy.  A home blood pressure monitor.  If you are between 45 years and 39 years old, ask your health care provider if you should take aspirin to prevent strokes.  Have regular diabetes screenings. This involves taking a blood sample to check your fasting blood sugar level.  If you are at a normal weight and have a low risk for diabetes,  have this test once every three years after 57 years of age.  If you are overweight and have a high risk for diabetes, consider being tested at a younger age or more often. PREVENTING INFECTION  Hepatitis B  If you have a higher risk for hepatitis B, you should be screened for this virus. You are considered at high risk for hepatitis B if:  You were born in a country where hepatitis B is common. Ask your health care provider which countries are considered high risk.  Your parents were born in a high-risk country, and you have not been immunized against hepatitis B (hepatitis B vaccine).  You have HIV or AIDS.  You use needles to inject street drugs.  You live with someone who has hepatitis B.  You have had sex with someone who has hepatitis B.  You get hemodialysis treatment.  You take certain medicines for conditions, including cancer, organ transplantation, and autoimmune conditions. Hepatitis C  Blood testing is recommended for:  Everyone born from [birth year] through 1967.  Anyone with known risk factors for hepatitis C. Sexually transmitted infections (STIs)  You should be screened for sexually transmitted infections (STIs) including gonorrhea and chlamydia if:  You are sexually active and are younger than 57 years of age.  You are older than 57 years of age and your health care provider tells you that you are at risk for this type of infection.  Your sexual activity has changed since you were last screened and you are at an increased risk for chlamydia or gonorrhea. Ask your health care provider if you are at risk.  If you do not have HIV, but are at risk, it may be recommended that you take a prescription medicine daily to prevent HIV infection. This is called pre-exposure prophylaxis (PrEP). You are considered at risk if:  You are sexually active and do not regularly use condoms or know the HIV status of your partner(s).  You take drugs by injection.  You are sexually  active with a partner who has HIV. Talk with your health care provider about whether you are at high risk of being infected with HIV. If you choose to begin PrEP, you should first be tested for HIV. You should then be tested every 3 months for as long as you are taking PrEP.  PREGNANCY   If you are premenopausal and you may become pregnant, ask your health care provider about preconception counseling.  If you may  become pregnant, take 400 to 800 micrograms (mcg) of folic acid every day.  If you want to prevent pregnancy, talk to your health care provider about birth control (contraception). OSTEOPOROSIS AND MENOPAUSE   Osteoporosis is a disease in which the bones lose minerals and strength with aging. This can result in serious bone fractures. Your risk for osteoporosis can be identified using a bone density scan.  If you are 61 years of age or older, or if you are at risk for osteoporosis and fractures, ask your health care provider if you should be screened.  Ask your health care provider whether you should take a calcium or vitamin D supplement to lower your risk for osteoporosis.  Menopause may have certain physical symptoms and risks.  Hormone replacement therapy may reduce some of these symptoms and risks. Talk to your health care provider about whether hormone replacement therapy is right for you.  HOME CARE INSTRUCTIONS   Schedule regular health, dental, and eye exams.  Stay current with your immunizations.   Do not use any tobacco products including cigarettes, chewing tobacco, or electronic cigarettes.  If you are pregnant, do not drink alcohol.  If you are breastfeeding, limit how much and how often you drink alcohol.  Limit alcohol intake to no more than 1 drink per day for nonpregnant women. One drink equals 12 ounces of beer, 5 ounces of wine, or 1 ounces of hard liquor.  Do not use street drugs.  Do not share needles.  Ask your health care provider for help if  you need support or information about quitting drugs.  Tell your health care provider if you often feel depressed.  Tell your health care provider if you have ever been abused or do not feel safe at home.   This information is not intended to replace advice given to you by your health care provider. Make sure you discuss any questions you have with your health care provider.   Document Released: 02/18/2011 Document Revised: 08/26/2014 Document Reviewed: 07/07/2013 Elsevier Interactive Patient Education [employer].

## 2016-03-04 NOTE — Progress Notes (Signed)
Patient: Kayla English, Female    DOB: December 08, 1960, 55 y.o.   MRN: [id number] Visit Date: 03/04/2016  Today's Provider: Margaretann LovelessJennifer M Burnette, PA-C   Chief Complaint  Patient presents with  . Annual Exam   Subjective:    Annual physical exam Kayla GalloKaren K Coffie is a 55 y.o. female who presents today for health maintenance and complete physical. She feels well. She reports exercising some; she walks a lot for her job. She reports she is sleeping fairly well.  Pap: 07/2014-Normal, HPV negative Mammogram: 05/30/13- Benign Colonoscopy: 12/25/09-Internal hemorrhoids, repeat in 10 years -----------------------------------------------------------------   Review of Systems  Constitutional: Positive for fatigue. Negative for fever, chills, diaphoresis, activity change, appetite change and unexpected weight change.  HENT: Negative for congestion, ear pain, hearing loss, postnasal drip, rhinorrhea, sinus pressure, sneezing, sore throat, tinnitus and trouble swallowing.   Eyes: Negative.   Respiratory: Negative.   Cardiovascular: Negative.   Gastrointestinal: Negative.   Endocrine: Negative.   Genitourinary: Negative.   Musculoskeletal: Positive for back pain and arthralgias. Negative for myalgias, joint swelling, gait problem, neck pain and neck stiffness.  Skin: Negative.   Allergic/Immunologic: Positive for environmental allergies. Negative for food allergies and immunocompromised state.  Neurological: Positive for headaches. Negative for dizziness, tremors, seizures, syncope, facial asymmetry, speech difficulty, weakness, light-headedness and numbness.  Hematological: Negative.   Psychiatric/Behavioral: Negative.     Social History      She  reports that she has never smoked. She does not have any smokeless tobacco history on file. She reports that she does not drink alcohol or use illicit drugs.       Social History   Social History  . Marital Status: Married    Spouse  Name: N/A  . Number of Children: N/A  . Years of Education: N/A   Social History Main Topics  . Smoking status: Never Smoker   . Smokeless tobacco: None  . Alcohol Use: No  . Drug Use: No  . Sexual Activity: Not Asked   Other Topics Concern  . None   Social History Narrative    Past Medical History  Diagnosis Date  . Trochanteric bursitis   . Pelvic pain in female   . Weight gain   . Fatigue   . Sinusitis   . Insomnia   . Headache   . Avitaminosis D   . TMJ arthritis   . Snoring   . Rash   . Transient adjustment reaction with anxiety   . Cerumen impaction   . Migraine   . Polycystic ovaries   . Breast nodule   . Back pain   . Irregular menses   . Osteoarthritis   . Depression   . Fibromyalgia   . Fibrocystic breast   . Pelvic pain in female   . Urinary frequency   . UTI (lower urinary tract infection)      Patient Active Problem List   Diagnosis Date Noted  . Adjustment disorder with anxiety 01/29/2016  . Anxiety 01/29/2016  . AV (anaerobic vaginosis) 01/29/2016  . Body mass index (BMI) of 28.0-28.9 in adult 01/29/2016  . Encounter for other screening for malignant neoplasm of breast 01/29/2016  . Breast lump 01/29/2016  . Cerumen impaction 01/29/2016  . Cervical cancer screening 01/29/2016  . History of chicken pox 01/29/2016  . Encounter for screening for malignant neoplasm of colon 01/29/2016  . Dizziness 01/29/2016  . Fatigue 01/29/2016  . Bloodgood disease 01/29/2016  .  Fibrositis 01/29/2016  . Flu vaccine need 01/29/2016  . Cephalalgia 01/29/2016  . Blood in the urine 01/29/2016  . Received influenza vaccination at hospital 01/29/2016  . Cannot sleep 01/29/2016  . Irregular bleeding 01/29/2016  . Headache, migraine 01/29/2016  . Arthritis, degenerative 01/29/2016  . Adnexal pain 01/29/2016  . Bilateral polycystic ovarian syndrome 01/29/2016  . Post menopausal syndrome 01/29/2016  . Cutaneous eruption 01/29/2016  . Encounter for general  adult medical examination without abnormal findings 01/29/2016  . Sinus infection 01/29/2016  . Snores 01/29/2016  . Arthritis of temporomandibular joint 01/29/2016  . Bursitis, trochanteric 01/29/2016  . Severe obstructive sleep apnea 01/29/2016  . Infection of urinary tract 01/29/2016  . Candida vaginitis 01/29/2016  . Avitaminosis D 01/29/2016  . Abnormal weight gain 01/29/2016  . Depression 10/04/2015  . Back pain 02/22/2015  . Personal history of other diseases of the musculoskeletal system and connective tissue 11/10/2014    Past Surgical History  Procedure Laterality Date  . Neck disc sugery  2012  . Shoulder surgery  2009  . Cesarean section  1977    Family History        No family status information on file.        Her family history is negative for Kidney disease, Bladder Cancer, and Prostate cancer.    No Known Allergies  Current Meds  Medication Sig  . Calcium Carbonate-Vitamin D (CALCIUM-VITAMIN D) 500-200 MG-UNIT tablet Take by mouth.  . conjugated estrogens (PREMARIN) vaginal cream Place 1 Applicatorful vaginally daily. Apply 0.5mg  (pea-sized amount)  just inside the vaginal introitus with a finger-tip every night for two weeks and then Monday, Wednesday and Friday nights.  . DULoxetine (CYMBALTA) 60 MG capsule Take 1 capsule (60 mg total) by mouth daily. Needs ov.  Thanks.  Marland Kitchen ibuprofen (ADVIL,MOTRIN) 400 MG tablet Take 400 mg by mouth every 6 (six) hours as needed.  . meloxicam (MOBIC) 15 MG tablet Take 15 mg by mouth as needed for pain.  . mirabegron ER (MYRBETRIQ) 25 MG TB24 tablet Take 25 mg by mouth daily.  . Multiple Vitamin (MULTI-VITAMINS) TABS Take by mouth.  . Probiotic Product (PROBIOTIC & ACIDOPHILUS EX ST PO) Take by mouth.  . SUMAtriptan (IMITREX) 100 MG tablet Take 100 mg by mouth every 2 (two) hours as needed for migraine. May repeat in 2 hours if headache persists or recurs.  . topiramate (TOPAMAX) 100 MG tablet Take by mouth.  . Vitamin D,  Cholecalciferol, 1000 UNITS CAPS Take by mouth.    Patient Care Team: Margaretann Loveless, PA-C as PCP - General (Family Medicine)     Objective:   Vitals: BP 112/68 mmHg  Pulse 80  Temp(Src) 98.7 F (37.1 C) (Oral)  Resp 16  Ht 5' 4.5" (1.638 m)  Wt 187 lb (84.823 kg)  BMI 31.61 kg/m2   Physical Exam  Constitutional: She is oriented to person, place, and time. She appears well-developed and well-nourished. No distress.  HENT:  Head: Normocephalic and atraumatic.  Right Ear: External ear normal.  Left Ear: External ear normal.  Nose: Nose normal.  Mouth/Throat: Oropharynx is clear and moist. No oropharyngeal exudate.  Eyes: Conjunctivae and EOM are normal. Pupils are equal, round, and reactive to light. Right eye exhibits no discharge. Left eye exhibits no discharge. No scleral icterus.  Neck: Normal range of motion. Neck supple. No JVD present. No tracheal deviation present. No thyromegaly present.  Cardiovascular: Normal rate, regular rhythm, normal heart sounds and intact distal pulses.  Exam reveals no gallop and no friction rub.   No murmur heard. Pulmonary/Chest: Effort normal and breath sounds normal. No respiratory distress. She has no wheezes. She has no rales. She exhibits no tenderness. Right breast exhibits no inverted nipple, no mass, no nipple discharge, no skin change and no tenderness. Left breast exhibits no inverted nipple, no mass, no nipple discharge, no skin change and no tenderness. Breasts are symmetrical.  Abdominal: Soft. Bowel sounds are normal. She exhibits no distension and no mass. There is no tenderness. There is no rebound and no guarding.  Musculoskeletal: Normal range of motion. She exhibits no edema or tenderness.  Lymphadenopathy:    She has no cervical adenopathy.  Neurological: She is alert and oriented to person, place, and time.  Skin: Skin is warm and dry. No rash noted. She is not diaphoretic.  Psychiatric: She has a normal mood and  affect. Her behavior is normal. Judgment and thought content normal.  Vitals reviewed.    Depression Screen PHQ 2/9 Scores 03/04/2016  PHQ - 2 Score 0      Assessment & Plan:     Routine Health Maintenance and Physical Exam  Exercise Activities and Dietary recommendations Goals    None       There is no immunization history on file for this patient.  Health Maintenance  Topic Date Due  . Hepatitis C Screening  [birth year]  . HIV Screening  05/07/1974  . TETANUS/TDAP  05/07/1978  . PAP SMEAR  05/07/1980  . MAMMOGRAM  05/07/2009  . COLONOSCOPY  05/07/2009  . INFLUENZA VACCINE  03/19/2016      Discussed health benefits of physical activity, and encouraged her to engage in regular exercise appropriate for her age and condition.  1. Annual physical exam Normal physical exam today. Will check labs as below and f/u pending lab results. If labs are stable and WNL she will not need to have these rechecked for one year at her next annual physical exam. She is to call the office in the meantime if she has any acute issue, questions or concerns. - CBC with Differential - Comprehensive metabolic panel - TSH  2. Depression Stable. Diagnosis pulled for medication refill. Continue current medical treatment plan. - DULoxetine (CYMBALTA) 60 MG capsule; Take 1 capsule (60 mg total) by mouth daily.  Dispense: 90 capsule; Refill: 3  3. Breast cancer screening Breast exam today was normal. There is no family history of breast cancer. She does perform regular self breast exams. Mammogram was ordered as below. Information for [facility] was given to patient so she may schedule her mammogram at her convenience. - Mammogram Digital Screening; Future  4. Encounter for lipid screening for cardiovascular disease Will check labs as below and f/u pending results. - Lipid panel   --------------------------------------------------------------------    Margaretann Loveless, PA-C    [facility]

## 2016-03-14 LAB — COMPREHENSIVE METABOLIC PANEL
ALBUMIN: 4.4 g/dL (ref 3.5–5.5)
ALK PHOS: 64 IU/L (ref 39–117)
ALT: 13 IU/L (ref 0–32)
AST: 18 IU/L (ref 0–40)
Albumin/Globulin Ratio: 1.6 (ref 1.2–2.2)
BUN / CREAT RATIO: 22 (ref 9–23)
BUN: 21 mg/dL (ref 6–24)
Bilirubin Total: 0.3 mg/dL (ref 0.0–1.2)
CO2: 21 mmol/L (ref 18–29)
CREATININE: 0.96 mg/dL (ref 0.57–1.00)
Calcium: 9.8 mg/dL (ref 8.7–10.2)
Chloride: 106 mmol/L (ref 96–106)
GFR calc Af Amer: 76 mL/min/{1.73_m2} (ref >59)
GFR calc non Af Amer: 66 mL/min/{1.73_m2} (ref >59)
GLUCOSE: 88 mg/dL (ref 65–99)
Globulin, Total: 2.7 g/dL (ref 1.5–4.5)
Potassium: 4.4 mmol/L (ref 3.5–5.2)
Sodium: 143 mmol/L (ref 134–144)
Total Protein: 7.1 g/dL (ref 6.0–8.5)

## 2016-03-14 LAB — CBC WITH DIFFERENTIAL/PLATELET
BASOS ABS: 0 10*3/uL (ref 0.0–0.2)
Basos: 1 %
EOS (ABSOLUTE): 0.2 10*3/uL (ref 0.0–0.4)
Eos: 3 %
HEMOGLOBIN: 13.2 g/dL (ref 11.1–15.9)
Hematocrit: 39.3 % (ref 34.0–46.6)
IMMATURE GRANS (ABS): 0 10*3/uL (ref 0.0–0.1)
Immature Granulocytes: 0 %
LYMPHS ABS: 2.4 10*3/uL (ref 0.7–3.1)
LYMPHS: 38 %
MCH: 29.3 pg (ref 26.6–33.0)
MCHC: 33.6 g/dL (ref 31.5–35.7)
MCV: 87 fL (ref 79–97)
MONOCYTES: 10 %
Monocytes Absolute: 0.6 10*3/uL (ref 0.1–0.9)
NEUTROS PCT: 48 %
Neutrophils Absolute: 3.2 10*3/uL (ref 1.4–7.0)
Platelets: 284 10*3/uL (ref 150–379)
RBC: 4.5 x10E6/uL (ref 3.77–5.28)
RDW: 13.9 % (ref 12.3–15.4)
WBC: 6.5 10*3/uL (ref 3.4–10.8)

## 2016-03-14 LAB — LIPID PANEL
CHOLESTEROL TOTAL: 204 mg/dL — AB (ref 100–199)
Chol/HDL Ratio: 3 ratio units (ref 0.0–4.4)
HDL: 68 mg/dL (ref >39)
LDL CALC: 105 mg/dL — AB (ref 0–99)
TRIGLYCERIDES: 157 mg/dL — AB (ref 0–149)
VLDL CHOLESTEROL CAL: 31 mg/dL (ref 5–40)

## 2016-03-14 LAB — TSH: TSH: 1.9 u[IU]/mL (ref 0.450–4.500)

## 2016-06-10 ENCOUNTER — Other Ambulatory Visit: Payer: Self-pay | Admitting: Family Medicine

## 2016-06-10 DIAGNOSIS — F32A Depression, unspecified: Secondary | ICD-10-CM

## 2016-06-10 DIAGNOSIS — F329 Major depressive disorder, single episode, unspecified: Secondary | ICD-10-CM

## 2016-07-15 ENCOUNTER — Ambulatory Visit
Admission: RE | Admit: 2016-07-15 | Discharge: 2016-07-15 | Disposition: A | Discharge status: Home / Self Care | Payer: 59 | Source: Ambulatory Visit | Attending: Physician Assistant | Admitting: Physician Assistant

## 2016-07-15 DIAGNOSIS — Z1239 Encounter for other screening for malignant neoplasm of breast: Secondary | ICD-10-CM

## 2016-07-15 DIAGNOSIS — Z1231 Encounter for screening mammogram for malignant neoplasm of breast: Secondary | ICD-10-CM | POA: Diagnosis not present

## 2016-07-17 ENCOUNTER — Telehealth: Payer: Self-pay

## 2016-07-17 NOTE — Telephone Encounter (Signed)
[id number] 

## 2016-07-17 NOTE — Telephone Encounter (Signed)
-----   Message from Margaretann LovelessJennifer M Burnette, [place]PA-C sent at 07/17/2016  8:24 AM EST ----- Normal mammogram. Repeat screening in one year.

## 2016-07-17 NOTE — Telephone Encounter (Signed)
Patient was advised. KW 

## 2016-07-27 ENCOUNTER — Other Ambulatory Visit: Payer: Self-pay | Admitting: Urology

## 2016-11-23 ENCOUNTER — Other Ambulatory Visit: Payer: Self-pay | Admitting: Physician Assistant

## 2016-11-23 DIAGNOSIS — F329 Major depressive disorder, single episode, unspecified: Secondary | ICD-10-CM

## 2016-11-23 DIAGNOSIS — F32A Depression, unspecified: Secondary | ICD-10-CM

## 2016-12-20 ENCOUNTER — Telehealth: Payer: Self-pay

## 2016-12-20 DIAGNOSIS — K219 Gastro-esophageal reflux disease without esophagitis: Secondary | ICD-10-CM

## 2016-12-20 MED ORDER — OMEPRAZOLE 20 MG PO CPDR: 20.0000 mg | DELAYED_RELEASE_CAPSULE | Freq: Every day | ORAL | 3 refills | Status: DC

## 2016-12-20 NOTE — Telephone Encounter (Signed)
Omeprazole sent to [facility]

## 2016-12-20 NOTE — Telephone Encounter (Signed)
Patient is requesting medication refill. Medication; Omeprazole 20 mg. Take 1 capsule daily.  Pharmacy: [facility].  Thanks,  -Joseline

## 2017-01-03 ENCOUNTER — Encounter: Payer: Self-pay | Admitting: Physician Assistant

## 2017-01-03 ENCOUNTER — Ambulatory Visit (INDEPENDENT_AMBULATORY_CARE_PROVIDER_SITE_OTHER): Payer: 59 | Admitting: Physician Assistant

## 2017-01-03 VITALS — BP 118/70 | HR 99 | Temp 98.2°F | Resp 16 | Wt 202.8 lb

## 2017-01-03 DIAGNOSIS — R600 Localized edema: Secondary | ICD-10-CM | POA: Diagnosis not present

## 2017-01-03 MED ORDER — FUROSEMIDE 20 MG PO TABS: 20.0000 mg | ORAL_TABLET | Freq: Every day | ORAL | 3 refills | Status: DC | PRN

## 2017-01-03 NOTE — Progress Notes (Signed)
Patient: Kayla GalloKaren K English Female    DOB: July 17, 1961   56 y.o.   MRN: [id number] Visit Date: 01/03/2017  Today's Provider: Margaretann LovelessJennifer M Burnette, PA-C   Chief Complaint  Patient presents with  . Edema   Subjective:    HPI Edema: Patient complains of edema. The location of the edema is lower leg(s) bilateral, feet bilateral.  The edema has been moderate and severe.  Onset of symptoms was 1 week ago, gradually improving since that time. The edema is present all day. The patient states never.  The swelling has been aggravated by nothing, relieved by nothing, and been associated with nothing. Cardiac risk factors include obesity (BMI >= 30 kg/m2).     No Known Allergies   Current Outpatient Prescriptions:  .  acetaminophen (TYLENOL) 325 MG tablet, Take by mouth. Reported on 03/04/2016, Disp: , Rfl:  .  Calcium Carbonate-Vitamin D (CALCIUM-VITAMIN D) 500-200 MG-UNIT tablet, Take by mouth., Disp: , Rfl:  .  conjugated estrogens (PREMARIN) vaginal cream, Place 1 Applicatorful vaginally daily. Apply 0.5mg  (pea-sized amount)  just inside the vaginal introitus with a finger-tip every night for two weeks and then Monday, Wednesday and Friday nights., Disp: 30 g, Rfl: 12 .  DULoxetine (CYMBALTA) 60 MG capsule, Take 1 capsule (60 mg total) by mouth daily., Disp: 90 capsule, Rfl: 3 .  ibuprofen (ADVIL,MOTRIN) 400 MG tablet, Take 400 mg by mouth every 6 (six) hours as needed., Disp: , Rfl:  .  isometheptene-acetaminophen-dichloralphenazone (MIDRIN) 65-100-325 MG capsule, Take by mouth. Reported on 03/04/2016, Disp: , Rfl:  .  Multiple Vitamin (MULTI-VITAMINS) TABS, Take by mouth., Disp: , Rfl:  .  omeprazole (PRILOSEC) 20 MG capsule, Take 1 capsule (20 mg total) by mouth daily., Disp: 90 capsule, Rfl: 3 .  Probiotic Product (PROBIOTIC & ACIDOPHILUS EX ST PO), Take by mouth., Disp: , Rfl:  .  SUMAtriptan (IMITREX) 100 MG tablet, Take 100 mg by mouth every 2 (two) hours as needed for migraine. May  repeat in 2 hours if headache persists or recurs., Disp: , Rfl:  .  topiramate (TOPAMAX) 100 MG tablet, Take by mouth., Disp: , Rfl:  .  Vitamin D, Cholecalciferol, 1000 UNITS CAPS, Take by mouth., Disp: , Rfl:  .  meloxicam (MOBIC) 15 MG tablet, Take 15 mg by mouth as needed for pain., Disp: , Rfl:  .  mirabegron ER (MYRBETRIQ) 25 MG TB24 tablet, Take 25 mg by mouth daily., Disp: , Rfl:   Review of Systems  Constitutional: Positive for fatigue (a little). Negative for activity change, appetite change, fever and unexpected weight change.  HENT: Negative.   Respiratory: Negative.   Cardiovascular: Positive for leg swelling. Negative for chest pain and palpitations.  Gastrointestinal: Negative.   Neurological: Negative.     Social History  Substance Use Topics  . Smoking status: Never Smoker  . Smokeless tobacco: Never Used  . Alcohol use No   Objective:   BP 118/70 (BP Location: Right Arm, Patient Position: Sitting, Cuff Size: Normal)   Pulse 99   Temp 98.2 F (36.8 C) (Oral)   Resp 16   Wt 202 lb 12.8 oz (92 kg)   SpO2 97%   BMI 34.27 kg/m    Physical Exam  Constitutional: She appears well-developed and well-nourished. No distress.  Neck: Normal range of motion. Neck supple. No JVD present. No tracheal deviation present. No thyromegaly present.  Cardiovascular: Normal rate, regular rhythm and normal heart sounds.  Exam reveals no gallop  and no friction rub.   No murmur heard. Pulmonary/Chest: Effort normal and breath sounds normal. No respiratory distress. She has no wheezes. She has no rales.  Musculoskeletal: Normal range of motion. She exhibits edema (1+ pitting edema).  Lymphadenopathy:    She has no cervical adenopathy.  Skin: She is not diaphoretic.  Vitals reviewed.      Assessment & Plan:     1. Bilateral lower extremity edema Will check labs as below. I will f/u pending labs as below. Lasix added as below. Patient advised to increase potassium rich foods. If  she is taking the furosemide more regulalry she is to call for a potassium supplementation.  - CBC w/Diff/Platelet - Basic Metabolic Panel (BMET) - B Nat Peptide - furosemide (LASIX) 20 MG tablet; Take 1 tablet (20 mg total) by mouth daily as needed.  Dispense: 30 tablet; Refill: 3       Margaretann Loveless, PA-C  [facility]

## 2017-01-03 NOTE — Patient Instructions (Signed)
Edema Edema is an abnormal buildup of fluids in your bodytissues. Edema is somewhatdependent on gravity to pull the fluid to the lowest place in your body. That makes the condition more common in the legs and thighs (lower extremities). Painless swelling of the feet and ankles is common and becomes more likely as you get older. It is also common in looser tissues, like around your eyes. When the affected area is squeezed, the fluid may move out of that spot and leave a dent for a few moments. This dent is called pitting. What are the causes? There are many possible causes of edema. Eating too much salt and being on your feet or sitting for a long time can cause edema in your legs and ankles. Hot weather may make edema worse. Common medical causes of edema include:  Heart failure.  Liver disease.  Kidney disease.  Weak blood vessels in your legs.  Cancer.  An injury.  Pregnancy.  Some medications.  Obesity. What are the signs or symptoms? Edema is usually painless.Your skin may look swollen or shiny. How is this diagnosed? Your health care provider may be able to diagnose edema by asking about your medical history and doing a physical exam. You may need to have tests such as X-rays, an electrocardiogram, or blood tests to check for medical conditions that may cause edema. How is this treated? Edema treatment depends on the cause. If you have heart, liver, or kidney disease, you need the treatment appropriate for these conditions. General treatment may include:  Elevation of the affected body part above the level of your heart.  Compression of the affected body part. Pressure from elastic bandages or support stockings squeezes the tissues and forces fluid back into the blood vessels. This keeps fluid from entering the tissues.  Restriction of fluid and salt intake.  Use of a water pill (diuretic). These medications are appropriate only for some types of edema. They pull fluid out  of your body and make you urinate more often. This gets rid of fluid and reduces swelling, but diuretics can have side effects. Only use diuretics as directed by your health care provider. Follow these instructions at home:  Keep the affected body part above the level of your heart when you are lying down.  Do not sit still or stand for prolonged periods.  Do not put anything directly under your knees when lying down.  Do not wear constricting clothing or garters on your upper legs.  Exercise your legs to work the fluid back into your blood vessels. This may help the swelling go down.  Wear elastic bandages or support stockings to reduce ankle swelling as directed by your health care provider.  Eat a low-salt diet to reduce fluid if your health care provider recommends it.  Only take medicines as directed by your health care provider. Contact a health care provider if:  Your edema is not responding to treatment.  You have heart, liver, or kidney disease and notice symptoms of edema.  You have edema in your legs that does not improve after elevating them.  You have sudden and unexplained weight gain. Get help right away if:  You develop shortness of breath or chest pain.  You cannot breathe when you lie down.  You develop pain, redness, or warmth in the swollen areas.  You have heart, liver, or kidney disease and suddenly get edema.  You have a fever and your symptoms suddenly get worse. This information is not intended  to replace advice given to you by your health care provider. Make sure you discuss any questions you have with your health care provider. Document Released: 08/05/2005 Document Revised: 01/11/2016 Document Reviewed: 05/28/2013 Elsevier Interactive Patient Education  2017 [employer] Furosemide tablets What is this medicine? FUROSEMIDE (fyoor [place] se mide) is a diuretic. It helps you make more urine and to lose salt and excess water from your body. This  medicine is used to treat high blood pressure, and edema or swelling from heart, kidney, or liver disease. This medicine may be used for other purposes; ask your health care provider or pharmacist if you have questions. COMMON BRAND NAME(S): Active-Medicated Specimen Kit, Delone, Diuscreen, Lasix, RX Specimen Collection Kit, Specimen Collection Kit, URINX Medicated Specimen Collection What should I tell my health care provider before I take this medicine? They need to know if you have any of these conditions: -abnormal blood electrolytes -diarrhea or vomiting -gout -heart disease -kidney disease, small amounts of urine, or difficulty passing urine -liver disease -thyroid disease -an unusual or allergic reaction to furosemide, sulfa drugs, other medicines, foods, dyes, or preservatives -pregnant or trying to get pregnant -breast-feeding How should I use this medicine? Take this medicine by mouth with a glass of water. Follow the directions on the prescription label. You may take this medicine with or without food. If it upsets your stomach, take it with food or milk. Do not take your medicine more often than directed. Remember that you will need to pass more urine after taking this medicine. Do not take your medicine at a time of day that will cause you problems. Do not take at bedtime. Talk to your pediatrician regarding the use of this medicine in children. While this drug may be prescribed for selected conditions, precautions do apply. Overdosage: If you think you have taken too much of this medicine contact a poison control center or emergency room at once. NOTE: This medicine is only for you. Do not share this medicine with others. What if I miss a dose? If you miss a dose, take it as soon as you can. If it is almost time for your next dose, take only that dose. Do not take double or extra doses. What may interact with this medicine? -aspirin and aspirin-like medicines -certain  antibiotics -chloral hydrate -cisplatin -cyclosporine -digoxin -diuretics -laxatives -lithium -medicines for blood pressure -medicines that relax muscles for surgery -methotrexate -NSAIDs, medicines for pain and inflammation like ibuprofen, naproxen, or indomethacin -phenytoin -steroid medicines like prednisone or cortisone -sucralfate -thyroid hormones This list may not describe all possible interactions. Give your health care provider a list of all the medicines, herbs, non-prescription drugs, or dietary supplements you use. Also tell them if you smoke, drink alcohol, or use illegal drugs. Some items may interact with your medicine. What should I watch for while using this medicine? Visit your doctor or health care professional for regular checks on your progress. Check your blood pressure regularly. Ask your doctor or health care professional what your blood pressure should be, and when you should contact him or her. If you are a diabetic, check your blood sugar as directed. You may need to be on a special diet while taking this medicine. Check with your doctor. Also, ask how many glasses of fluid you need to drink a day. You must not get dehydrated. You may get drowsy or dizzy. Do not drive, use machinery, or do anything that needs mental alertness until you know how this drug affects  you. Do not stand or sit up quickly, especially if you are an older patient. This reduces the risk of dizzy or fainting spells. Alcohol can make you more drowsy and dizzy. Avoid alcoholic drinks. This medicine can make you more sensitive to the sun. Keep out of the sun. If you cannot avoid being in the sun, wear protective clothing and use sunscreen. Do not use sun lamps or tanning beds/booths. What side effects may I notice from receiving this medicine? Side effects that you should report to your doctor or health care professional as soon as possible: -blood in urine or stools -dry mouth -fever or  chills -hearing loss or ringing in the ears -irregular heartbeat -muscle pain or weakness, cramps -skin rash -stomach upset, pain, or nausea -tingling or numbness in the hands or feet -unusually weak or tired -vomiting or diarrhea -yellowing of the eyes or skin Side effects that usually do not require medical attention (report to your doctor or health care professional if they continue or are bothersome): -headache -loss of appetite -unusual bleeding or bruising This list may not describe all possible side effects. Call your doctor for medical advice about side effects. You may report side effects to FDA at 1-800-FDA-1088. Where should I keep my medicine? Keep out of the reach of children. Store at room temperature between 15 and 30 degrees C (59 and 86 degrees F). Protect from light. Throw away any unused medicine after the expiration date. NOTE: This sheet is a summary. It may not cover all possible information. If you have questions about this medicine, talk to your doctor, pharmacist, or health care provider.  2018 Elsevier/Gold Standard (2014-10-26 13:49:50) Potassium Content of Foods Potassium is a mineral found in many foods and drinks. It helps keep fluids and minerals balanced in your body and affects how steadily your heart beats. Potassium also helps control your blood pressure and keep your muscles and nervous system healthy. Certain health conditions and medicines may change the balance of potassium in your body. When this happens, you can help balance your level of potassium through the foods that you do or do not eat. Your health care provider or dietitian may recommend an amount of potassium that you should have each day. The following lists of foods provide the amount of potassium (in parentheses) per serving in each item. High in potassium The following foods and beverages have 200 mg or more of potassium per serving:  Apricots, 2 raw or 5 dry (200 mg).  Artichoke, 1  medium (345 mg).  Avocado, raw,  each (245 mg).  Banana, 1 medium (425 mg).  Beans, lima, or baked beans, canned,  cup (280 mg).  Beans, white, canned,  cup (595 mg).  Beef roast, 3 oz (320 mg).  Beef, ground, 3 oz (270 mg).  Beets, raw or cooked,  cup (260 mg).  Bran muffin, 2 oz (300 mg).  Broccoli,  cup (230 mg).  Brussels sprouts,  cup (250 mg).  Cantaloupe,  cup (215 mg).  Cereal, 100% bran,  cup (200-400 mg).  Cheeseburger, single, fast food, 1 each (225-400 mg).  Chicken, 3 oz (220 mg).  Clams, canned, 3 oz (535 mg).  Crab, 3 oz (225 mg).  Dates, 5 each (270 mg).  Dried beans and peas,  cup (300-475 mg).  Figs, dried, 2 each (260 mg).  Fish: halibut, tuna, cod, snapper, 3 oz (480 mg).  Fish: salmon, haddock, swordfish, perch, 3 oz (300 mg).  Fish, tuna, canned 3 oz (200  mg).  [place] fries, fast food, 3 oz (470 mg).  Granola with fruit and nuts,  cup (200 mg).  Grapefruit juice,  cup (200 mg).  Greens, beet,  cup (655 mg).  Honeydew melon,  cup (200 mg).  Kale, raw, 1 cup (300 mg).  Kiwi, 1 medium (240 mg).  Kohlrabi, rutabaga, parsnips,  cup (280 mg).  Lentils,  cup (365 mg).  Mango, 1 each (325 mg).  Milk, chocolate, 1 cup (420 mg).  Milk: nonfat, low-fat, whole, buttermilk, 1 cup (350-380 mg).  Molasses, 1 Tbsp (295 mg).  Mushrooms,  cup (280) mg.  Nectarine, 1 each (275 mg).  Nuts: almonds, peanuts, hazelnuts, [place], cashew, mixed, 1 oz (200 mg).  Nuts, pistachios, 1 oz (295 mg).  Orange, 1 each (240 mg).  Orange juice,  cup (235 mg).  Papaya, medium,  fruit (390 mg).  Peanut butter, chunky, 2 Tbsp (240 mg).  Peanut butter, smooth, 2 Tbsp (210 mg).  Pear, 1 medium (200 mg).  Pomegranate, 1 whole (400 mg).  Pomegranate juice,  cup (215 mg).  Pork, 3 oz (350 mg).  Potato chips, salted, 1 oz (465 mg).  Potato, baked with skin, 1 medium (925 mg).  Potatoes, boiled,  cup (255  mg).  Potatoes, mashed,  cup (330 mg).  Prune juice,  cup (370 mg).  Prunes, 5 each (305 mg).  Pudding, chocolate,  cup (230 mg).  Pumpkin, canned,  cup (250 mg).  Raisins, seedless,  cup (270 mg).  Seeds, sunflower or pumpkin, 1 oz (240 mg).  Soy milk, 1 cup (300 mg).  Spinach,  cup (420 mg).  Spinach, canned,  cup (370 mg).  Sweet potato, baked with skin, 1 medium (450 mg).  Swiss chard,  cup (480 mg).  Tomato or vegetable juice,  cup (275 mg).  Tomato sauce or puree,  cup (400-550 mg).  Tomato, raw, 1 medium (290 mg).  Tomatoes, canned,  cup (200-300 mg).  [place], 3 oz (250 mg).  Wheat germ, 1 oz (250 mg).  Winter squash,  cup (250 mg).  Yogurt, plain or fruited, 6 oz (260-435 mg).  Zucchini,  cup (220 mg). Moderate in potassium The following foods and beverages have 50-200 mg of potassium per serving:  Apple, 1 each (150 mg).  Apple juice,  cup (150 mg).  Applesauce,  cup (90 mg).  Apricot nectar,  cup (140 mg).  Asparagus, small spears,  cup or 6 spears (155 mg).  Bagel, cinnamon raisin, 1 each (130 mg).  Bagel, egg or plain, 4 in., 1 each (70 mg).  Beans, green,  cup (90 mg).  Beans, yellow,  cup (190 mg).  Beer, regular, 12 oz (100 mg).  Beets, canned,  cup (125 mg).  Blackberries,  cup (115 mg).  Blueberries,  cup (60 mg).  Bread, whole wheat, 1 slice (70 mg).  Broccoli, raw,  cup (145 mg).  Cabbage,  cup (150 mg).  Carrots, cooked or raw,  cup (180 mg).  Cauliflower, raw,  cup (150 mg).  Celery, raw,  cup (155 mg).  Cereal, bran flakes, cup (120-150 mg).  Cheese, cottage,  cup (110 mg).  Cherries, 10 each (150 mg).  Chocolate, 1 oz bar (165 mg).  Coffee, brewed 6 oz (90 mg).  Corn,  cup or 1 ear (195 mg).  Cucumbers,  cup (80 mg).  Egg, large, 1 each (60 mg).  Eggplant,  cup (60 mg).  Endive, raw, cup (80 mg).  English muffin, 1 each (65 mg).  Fish,  orange roughy, 3 oz  (150 mg).  Frankfurter, beef or pork, 1 each (75 mg).  Fruit cocktail,  cup (115 mg).  Grape juice,  cup (170 mg).  Grapefruit,  fruit (175 mg).  Grapes,  cup (155 mg).  Greens: kale, turnip, collard,  cup (110-150 mg).  Ice cream or frozen yogurt, chocolate,  cup (175 mg).  Ice cream or frozen yogurt, vanilla,  cup (120-150 mg).  Lemons, limes, 1 each (80 mg).  Lettuce, all types, 1 cup (100 mg).  Mixed vegetables,  cup (150 mg).  Mushrooms, raw,  cup (110 mg).  Nuts: walnuts, pecans, or macadamia, 1 oz (125 mg).  Oatmeal,  cup (80 mg).  Okra,  cup (110 mg).  Onions, raw,  cup (120 mg).  Peach, 1 each (185 mg).  Peaches, canned,  cup (120 mg).  Pears, canned,  cup (120 mg).  Peas, green, frozen,  cup (90 mg).  Peppers, green,  cup (130 mg).  Peppers, red,  cup (160 mg).  Pineapple juice,  cup (165 mg).  Pineapple, fresh or canned,  cup (100 mg).  Plums, 1 each (105 mg).  Pudding, vanilla,  cup (150 mg).  Raspberries,  cup (90 mg).  Rhubarb,  cup (115 mg).  Rice, wild,  cup (80 mg).  Shrimp, 3 oz (155 mg).  Spinach, raw, 1 cup (170 mg).  Strawberries,  cup (125 mg).  Summer squash  cup (175-200 mg).  Swiss chard, raw, 1 cup (135 mg).  Tangerines, 1 each (140 mg).  Tea, brewed, 6 oz (65 mg).  Turnips,  cup (140 mg).  Watermelon,  cup (85 mg).  Wine, red, table, 5 oz (180 mg).  Wine, white, table, 5 oz (100 mg). Low in potassium The following foods and beverages have less than 50 mg of potassium per serving.  Bread, white, 1 slice (30 mg).  Carbonated beverages, 12 oz (less than 5 mg).  Cheese, 1 oz (20-30 mg).  Cranberries,  cup (45 mg).  Cranberry juice cocktail,  cup (20 mg).  Fats and oils, 1 Tbsp (less than 5 mg).  Hummus, 1 Tbsp (32 mg).  Nectar: papaya, mango, or pear,  cup (35 mg).  Rice, white or brown,  cup (50 mg).  Spaghetti or macaroni,  cup cooked (30 mg).  Tortilla,  flour or corn, 1 each (50 mg).  Waffle, 4 in., 1 each (50 mg).  Water chestnuts,  cup (40 mg). This information is not intended to replace advice given to you by your health care provider. Make sure you discuss any questions you have with your health care provider. Document Released: 03/19/2005 Document Revised: 01/11/2016 Document Reviewed: 07/02/2013 Elsevier Interactive Patient Education  2017 [employer].

## 2017-01-07 LAB — CBC WITH DIFFERENTIAL/PLATELET
BASOS: 0 %
Basophils Absolute: 0 10*3/uL (ref 0.0–0.2)
EOS (ABSOLUTE): 0.2 10*3/uL (ref 0.0–0.4)
EOS: 3 %
HEMATOCRIT: 39.8 % (ref 34.0–46.6)
HEMOGLOBIN: 13.5 g/dL (ref 11.1–15.9)
Immature Grans (Abs): 0 10*3/uL (ref 0.0–0.1)
Immature Granulocytes: 0 %
LYMPHS ABS: 2.1 10*3/uL (ref 0.7–3.1)
Lymphs: 31 %
MCH: 29.2 pg (ref 26.6–33.0)
MCHC: 33.9 g/dL (ref 31.5–35.7)
MCV: 86 fL (ref 79–97)
MONOCYTES: 11 %
MONOS ABS: 0.7 10*3/uL (ref 0.1–0.9)
NEUTROS ABS: 3.8 10*3/uL (ref 1.4–7.0)
Neutrophils: 55 %
Platelets: 260 10*3/uL (ref 150–379)
RBC: 4.62 x10E6/uL (ref 3.77–5.28)
RDW: 13.9 % (ref 12.3–15.4)
WBC: 6.9 10*3/uL (ref 3.4–10.8)

## 2017-01-07 LAB — BASIC METABOLIC PANEL
BUN / CREAT RATIO: 18 (ref 9–23)
BUN: 16 mg/dL (ref 6–24)
CO2: 22 mmol/L (ref 18–29)
CREATININE: 0.91 mg/dL (ref 0.57–1.00)
Calcium: 9.5 mg/dL (ref 8.7–10.2)
Chloride: 108 mmol/L — ABNORMAL HIGH (ref 96–106)
GFR, EST AFRICAN AMERICAN: 81 mL/min/{1.73_m2} (ref >59)
GFR, EST NON AFRICAN AMERICAN: 70 mL/min/{1.73_m2} (ref >59)
Glucose: 99 mg/dL (ref 65–99)
POTASSIUM: 4.6 mmol/L (ref 3.5–5.2)
SODIUM: 144 mmol/L (ref 134–144)

## 2017-01-07 LAB — BRAIN NATRIURETIC PEPTIDE: BNP: 14.5 pg/mL (ref 0.0–100.0)

## 2017-01-12 ENCOUNTER — Other Ambulatory Visit: Payer: Self-pay | Admitting: Physician Assistant

## 2017-01-12 DIAGNOSIS — F32A Depression, unspecified: Secondary | ICD-10-CM

## 2017-01-12 DIAGNOSIS — F329 Major depressive disorder, single episode, unspecified: Secondary | ICD-10-CM

## 2017-01-14 ENCOUNTER — Telehealth: Payer: Self-pay

## 2017-01-14 NOTE — Telephone Encounter (Signed)
Last FU/CPE 03/04/2016. Pt has appointment 03/05/2017. Allene DillonEmily Drozdowski, CMA

## 2017-01-14 NOTE — Telephone Encounter (Signed)
-----   Message from Jennifer M Burnette, PA-C sent at 01/14/2017  2:15 PM EDT ----- All labs are within normal limits and stable.  Thanks! -JB 

## 2017-01-14 NOTE — Telephone Encounter (Signed)
PATIENT ADVISED.KW

## 2017-01-15 ENCOUNTER — Telehealth: Payer: Self-pay | Admitting: Physician Assistant

## 2017-01-15 NOTE — Telephone Encounter (Signed)
Pt is returning call.  XL#[phone]/[id number]#[phone]/MW

## 2017-01-15 NOTE — Telephone Encounter (Signed)
Patient advised as below.  

## 2017-01-15 NOTE — Telephone Encounter (Signed)
-----   Message from Margaretann LovelessJennifer M Burnette, [place]PA-C sent at 01/14/2017  2:15 PM EDT ----- All labs are within normal limits and stable.  Thanks! -JB

## 2017-03-05 ENCOUNTER — Ambulatory Visit (INDEPENDENT_AMBULATORY_CARE_PROVIDER_SITE_OTHER): Payer: 59 | Admitting: Physician Assistant

## 2017-03-05 ENCOUNTER — Encounter: Payer: Self-pay | Admitting: Physician Assistant

## 2017-03-05 VITALS — BP 118/86 | HR 83 | Temp 97.9°F | Resp 16 | Ht 65.0 in | Wt 193.0 lb

## 2017-03-05 DIAGNOSIS — Z1231 Encounter for screening mammogram for malignant neoplasm of breast: Secondary | ICD-10-CM | POA: Diagnosis not present

## 2017-03-05 DIAGNOSIS — Z131 Encounter for screening for diabetes mellitus: Secondary | ICD-10-CM

## 2017-03-05 DIAGNOSIS — E7800 Pure hypercholesterolemia, unspecified: Secondary | ICD-10-CM

## 2017-03-05 DIAGNOSIS — Z1159 Encounter for screening for other viral diseases: Secondary | ICD-10-CM | POA: Diagnosis not present

## 2017-03-05 DIAGNOSIS — Z1329 Encounter for screening for other suspected endocrine disorder: Secondary | ICD-10-CM

## 2017-03-05 DIAGNOSIS — Z0000 Encounter for general adult medical examination without abnormal findings: Secondary | ICD-10-CM | POA: Diagnosis not present

## 2017-03-05 DIAGNOSIS — Z124 Encounter for screening for malignant neoplasm of cervix: Secondary | ICD-10-CM | POA: Diagnosis not present

## 2017-03-05 DIAGNOSIS — Z114 Encounter for screening for human immunodeficiency virus [HIV]: Secondary | ICD-10-CM | POA: Diagnosis not present

## 2017-03-05 DIAGNOSIS — Z1239 Encounter for other screening for malignant neoplasm of breast: Secondary | ICD-10-CM

## 2017-03-05 NOTE — Patient Instructions (Signed)
Health Maintenance for Postmenopausal Women Menopause is a normal process in which your reproductive ability comes to an end. This process happens gradually over a span of months to years, usually between the ages of 22 and 9. Menopause is complete when you have missed 12 consecutive menstrual periods. It is important to talk with your health care provider about some of the most common conditions that affect postmenopausal women, such as heart disease, cancer, and bone loss (osteoporosis). Adopting a healthy lifestyle and getting preventive care can help to promote your health and wellness. Those actions can also lower your chances of developing some of these common conditions. What should I know about menopause? During menopause, you may experience a number of symptoms, such as:  Moderate-to-severe hot flashes.  Night sweats.  Decrease in sex drive.  Mood swings.  Headaches.  Tiredness.  Irritability.  Memory problems.  Insomnia.  Choosing to treat or not to treat menopausal changes is an individual decision that you make with your health care provider. What should I know about hormone replacement therapy and supplements? Hormone therapy products are effective for treating symptoms that are associated with menopause, such as hot flashes and night sweats. Hormone replacement carries certain risks, especially as you become older. If you are thinking about using estrogen or estrogen with progestin treatments, discuss the benefits and risks with your health care provider. What should I know about heart disease and stroke? Heart disease, heart attack, and stroke become more likely as you age. This may be due, in part, to the hormonal changes that your body experiences during menopause. These can affect how your body processes dietary fats, triglycerides, and cholesterol. Heart attack and stroke are both medical emergencies. There are many things that you can do to help prevent heart disease  and stroke:  Have your blood pressure checked at least every 1-2 years. High blood pressure causes heart disease and increases the risk of stroke.  If you are 53-22 years old, ask your health care provider if you should take aspirin to prevent a heart attack or a stroke.  Do not use any tobacco products, including cigarettes, chewing tobacco, or electronic cigarettes. If you need help quitting, ask your health care provider.  It is important to eat a healthy diet and maintain a healthy weight. ? Be sure to include plenty of vegetables, fruits, low-fat dairy products, and lean protein. ? Avoid eating foods that are high in solid fats, added sugars, or salt (sodium).  Get regular exercise. This is one of the most important things that you can do for your health. ? Try to exercise for at least 150 minutes each week. The type of exercise that you do should increase your heart rate and make you sweat. This is known as moderate-intensity exercise. ? Try to do strengthening exercises at least twice each week. Do these in addition to the moderate-intensity exercise.  Know your numbers.Ask your health care provider to check your cholesterol and your blood glucose. Continue to have your blood tested as directed by your health care provider.  What should I know about cancer screening? There are several types of cancer. Take the following steps to reduce your risk and to catch any cancer development as early as possible. Breast Cancer  Practice breast self-awareness. ? This means understanding how your breasts normally appear and feel. ? It also means doing regular breast self-exams. Let your health care provider know about any changes, no matter how small.  If you are 40  or older, have a clinician do a breast exam (clinical breast exam or CBE) every year. Depending on your age, family history, and medical history, it may be recommended that you also have a yearly breast X-ray (mammogram).  If you  have a family history of breast cancer, talk with your health care provider about genetic screening.  If you are at high risk for breast cancer, talk with your health care provider about having an MRI and a mammogram every year.  Breast cancer (BRCA) gene test is recommended for women who have family members with BRCA-related cancers. Results of the assessment will determine the need for genetic counseling and BRCA1 and for BRCA2 testing. BRCA-related cancers include these types: ? Breast. This occurs in males or females. ? Ovarian. ? Tubal. This may also be called fallopian tube cancer. ? Cancer of the abdominal or pelvic lining (peritoneal cancer). ? Prostate. ? Pancreatic.  Cervical, Uterine, and Ovarian Cancer Your health care provider may recommend that you be screened regularly for cancer of the pelvic organs. These include your ovaries, uterus, and vagina. This screening involves a pelvic exam, which includes checking for microscopic changes to the surface of your cervix (Pap test).  For women ages 21-65, health care providers may recommend a pelvic exam and a Pap test every three years. For women ages 79-65, they may recommend the Pap test and pelvic exam, combined with testing for human papilloma virus (HPV), every five years. Some types of HPV increase your risk of cervical cancer. Testing for HPV may also be done on women of any age who have unclear Pap test results.  Other health care providers may not recommend any screening for nonpregnant women who are considered low risk for pelvic cancer and have no symptoms. Ask your health care provider if a screening pelvic exam is right for you.  If you have had past treatment for cervical cancer or a condition that could lead to cancer, you need Pap tests and screening for cancer for at least 20 years after your treatment. If Pap tests have been discontinued for you, your risk factors (such as having a new sexual partner) need to be  reassessed to determine if you should start having screenings again. Some women have medical problems that increase the chance of getting cervical cancer. In these cases, your health care provider may recommend that you have screening and Pap tests more often.  If you have a family history of uterine cancer or ovarian cancer, talk with your health care provider about genetic screening.  If you have vaginal bleeding after reaching menopause, tell your health care provider.  There are currently no reliable tests available to screen for ovarian cancer.  Lung Cancer Lung cancer screening is recommended for adults 69-62 years old who are at high risk for lung cancer because of a history of smoking. A yearly low-dose CT scan of the lungs is recommended if you:  Currently smoke.  Have a history of at least 30 pack-years of smoking and you currently smoke or have quit within the past 15 years. A pack-year is smoking an average of one pack of cigarettes per day for one year.  Yearly screening should:  Continue until it has been 15 years since you quit.  Stop if you develop a health problem that would prevent you from having lung cancer treatment.  Colorectal Cancer  This type of cancer can be detected and can often be prevented.  Routine colorectal cancer screening usually begins at  age 42 and continues through age 45.  If you have risk factors for colon cancer, your health care provider may recommend that you be screened at an earlier age.  If you have a family history of colorectal cancer, talk with your health care provider about genetic screening.  Your health care provider may also recommend using home test kits to check for hidden blood in your stool.  A small camera at the end of a tube can be used to examine your colon directly (sigmoidoscopy or colonoscopy). This is done to check for the earliest forms of colorectal cancer.  Direct examination of the colon should be repeated every  5-10 years until age 71. However, if early forms of precancerous polyps or small growths are found or if you have a family history or genetic risk for colorectal cancer, you may need to be screened more often.  Skin Cancer  Check your skin from head to toe regularly.  Monitor any moles. Be sure to tell your health care provider: ? About any new moles or changes in moles, especially if there is a change in a mole's shape or color. ? If you have a mole that is larger than the size of a pencil eraser.  If any of your family members has a history of skin cancer, especially at a young age, talk with your health care provider about genetic screening.  Always use sunscreen. Apply sunscreen liberally and repeatedly throughout the day.  Whenever you are outside, protect yourself by wearing long sleeves, pants, a wide-brimmed hat, and sunglasses.  What should I know about osteoporosis? Osteoporosis is a condition in which bone destruction happens more quickly than new bone creation. After menopause, you may be at an increased risk for osteoporosis. To help prevent osteoporosis or the bone fractures that can happen because of osteoporosis, the following is recommended:  If you are 46-71 years old, get at least 1,000 mg of calcium and at least 600 mg of vitamin D per day.  If you are older than age 55 but younger than age 65, get at least 1,200 mg of calcium and at least 600 mg of vitamin D per day.  If you are older than age 54, get at least 1,200 mg of calcium and at least 800 mg of vitamin D per day.  Smoking and excessive alcohol intake increase the risk of osteoporosis. Eat foods that are rich in calcium and vitamin D, and do weight-bearing exercises several times each week as directed by your health care provider. What should I know about how menopause affects my mental health? Depression may occur at any age, but it is more common as you become older. Common symptoms of depression  include:  Low or sad mood.  Changes in sleep patterns.  Changes in appetite or eating patterns.  Feeling an overall lack of motivation or enjoyment of activities that you previously enjoyed.  Frequent crying spells.  Talk with your health care provider if you think that you are experiencing depression. What should I know about immunizations? It is important that you get and maintain your immunizations. These include:  Tetanus, diphtheria, and pertussis (Tdap) booster vaccine.  Influenza every year before the flu season begins.  Pneumonia vaccine.  Shingles vaccine.  Your health care provider may also recommend other immunizations. This information is not intended to replace advice given to you by your health care provider. Make sure you discuss any questions you have with your health care provider. Document Released: 09/27/2005  Document Revised: 02/23/2016 Document Reviewed: 05/09/2015 Elsevier Interactive Patient Education  2018 [employer] Inc[employer]  

## 2017-03-05 NOTE — Progress Notes (Signed)
Patient: Kayla English, Female    DOB: [birth year], 56 y.o.   MRN: [id number] Visit Date: 03/05/2017  Today's Provider: Margaretann Loveless, PA-C   Chief Complaint  Patient presents with  . Annual Exam   Subjective:    Annual physical exam Kayla English is a 56 y.o. female who presents today for health maintenance and complete physical. She feels well. Shereports exercising.She is a Hospital doctor. She reports she is sleeping poorly.  Last CPE: 03/04/16 Mammogram:07/15/16 BI-RADS 1:Negative Pap:07/2014 Normal, HPV-Negative Colonoscopy:12/25/09 Internal Hemorrhoids,repeat in 10 years. -----------------------------------------------------------------   Review of Systems  Constitutional: Positive for fatigue.  HENT: Negative.   Eyes: Negative.   Respiratory: Negative.   Cardiovascular: Negative.   Gastrointestinal: Negative.   Endocrine: Negative.   Genitourinary: Positive for urgency.  Musculoskeletal: Positive for back pain and myalgias.  Skin: Negative.   Allergic/Immunologic: Positive for environmental allergies.  Neurological: Negative.   Hematological: Bruises/bleeds easily.  Psychiatric/Behavioral: Negative.     Social History      She  reports that she has never smoked. She has never used smokeless tobacco. She reports that she does not drink alcohol or use drugs.       Social History   Social History  . Marital status: Married    Spouse name: N/A  . Number of children: N/A  . Years of education: N/A   Social History Main Topics  . Smoking status: Never Smoker  . Smokeless tobacco: Never Used  . Alcohol use No  . Drug use: No  . Sexual activity: Not Asked   Other Topics Concern  . None   Social History Narrative  . None    Past Medical History:  Diagnosis Date  . Avitaminosis D   . Back pain   . Breast nodule   . Cerumen impaction   . Depression   . Fatigue   . Fibrocystic breast   . Fibromyalgia   . Headache   . Insomnia   .  Irregular menses   . Migraine   . Osteoarthritis   . Pelvic pain in female   . Pelvic pain in female   . Polycystic ovaries   . Rash   . Sinusitis   . Snoring   . TMJ arthritis   . Transient adjustment reaction with anxiety   . Trochanteric bursitis   . Urinary frequency   . UTI (lower urinary tract infection)   . Weight gain      Patient Active Problem List   Diagnosis Date Noted  . Adjustment disorder with anxiety 01/29/2016  . Anxiety 01/29/2016  . Body mass index (BMI) of 28.0-28.9 in adult 01/29/2016  . Breast lump 01/29/2016  . Cerumen impaction 01/29/2016  . History of chicken pox 01/29/2016  . Dizziness 01/29/2016  . Fatigue 01/29/2016  . Bloodgood disease 01/29/2016  . Fibrositis 01/29/2016  . Cephalalgia 01/29/2016  . Cannot sleep 01/29/2016  . Headache, migraine 01/29/2016  . Arthritis, degenerative 01/29/2016  . Adnexal pain 01/29/2016  . Bilateral polycystic ovarian syndrome 01/29/2016  . Post menopausal syndrome 01/29/2016  . Cutaneous eruption 01/29/2016  . Snores 01/29/2016  . Arthritis of temporomandibular joint 01/29/2016  . Bursitis, trochanteric 01/29/2016  . Severe obstructive sleep apnea 01/29/2016  . Avitaminosis D 01/29/2016  . Abnormal weight gain 01/29/2016  . Depression 10/04/2015  . Back pain 02/22/2015    Past Surgical History:  Procedure Laterality Date  . CESAREAN SECTION  1977  . neck disc sugery  2012  . SHOULDER SURGERY  2009    Family History        Family Status  Relation Status  . Neg Hx (Not Specified)        Her family history is not on file.     No Known Allergies   Current Outpatient Prescriptions:  .  acetaminophen (TYLENOL) 325 MG tablet, Take by mouth. Reported on 03/04/2016, Disp: , Rfl:  .  Calcium Carbonate-Vitamin D (CALCIUM-VITAMIN D) 500-200 MG-UNIT tablet, Take by mouth., Disp: , Rfl:  .  conjugated estrogens (PREMARIN) vaginal cream, Place 1 Applicatorful vaginally daily. Apply 0.5mg  (pea-sized  amount)  just inside the vaginal introitus with a finger-tip every night for two weeks and then Monday, Wednesday and Friday nights., Disp: 30 g, Rfl: 12 .  DULoxetine (CYMBALTA) 60 MG capsule, TAKE 1 CAPSULE BY MOUTH  DAILY, Disp: 90 capsule, Rfl: 1 .  furosemide (LASIX) 20 MG tablet, Take 1 tablet (20 mg total) by mouth daily as needed., Disp: 30 tablet, Rfl: 3 .  ibuprofen (ADVIL,MOTRIN) 400 MG tablet, Take 400 mg by mouth every 6 (six) hours as needed., Disp: , Rfl:  .  isometheptene-acetaminophen-dichloralphenazone (MIDRIN) 65-100-325 MG capsule, Take by mouth. Reported on 03/04/2016, Disp: , Rfl:  .  meloxicam (MOBIC) 15 MG tablet, Take 15 mg by mouth as needed for pain., Disp: , Rfl:  .  mirabegron ER (MYRBETRIQ) 25 MG TB24 tablet, Take 25 mg by mouth daily., Disp: , Rfl:  .  Multiple Vitamin (MULTI-VITAMINS) TABS, Take by mouth., Disp: , Rfl:  .  omeprazole (PRILOSEC) 20 MG capsule, Take 1 capsule (20 mg total) by mouth daily., Disp: 90 capsule, Rfl: 3 .  Probiotic Product (PROBIOTIC & ACIDOPHILUS EX ST PO), Take by mouth., Disp: , Rfl:  .  SUMAtriptan (IMITREX) 100 MG tablet, Take 100 mg by mouth every 2 (two) hours as needed for migraine. May repeat in 2 hours if headache persists or recurs., Disp: , Rfl:  .  topiramate (TOPAMAX) 100 MG tablet, Take by mouth., Disp: , Rfl:  .  Vitamin D, Cholecalciferol, 1000 UNITS CAPS, Take by mouth., Disp: , Rfl:    Patient Care Team: Margaretann Loveless, PA-C as PCP - General (Family Medicine)      Objective:   Vitals: BP 118/86 (BP Location: Right Arm, Patient Position: Sitting, Cuff Size: Normal)   Pulse 83   Temp 97.9 F (36.6 C) (Oral)   Resp 16   Ht 5\' 5"  (1.651 m)   Wt 193 lb (87.5 kg)   BMI 32.12 kg/m    Vitals:   03/05/17 1526  BP: 118/86  Pulse: 83  Resp: 16  Temp: 97.9 F (36.6 C)  TempSrc: Oral  Weight: 193 lb (87.5 kg)  Height: 5\' 5"  (1.651 m)     Physical Exam  Constitutional: She is oriented to person, place, and  time. She appears well-developed and well-nourished. No distress.  HENT:  Head: Normocephalic and atraumatic.  Right Ear: Hearing, tympanic membrane, external ear and ear canal normal.  Left Ear: Hearing, tympanic membrane, external ear and ear canal normal.  Nose: Nose normal.  Mouth/Throat: Uvula is midline, oropharynx is clear and moist and mucous membranes are normal. No oropharyngeal exudate.  Eyes: Pupils are equal, round, and reactive to light. Conjunctivae and EOM are normal. Right eye exhibits no discharge. Left eye exhibits no discharge. No scleral icterus.  Neck: Normal range of motion. Neck supple. No JVD present. Carotid bruit is not present. No tracheal  deviation present. No thyromegaly present.  Cardiovascular: Normal rate, regular rhythm, normal heart sounds and intact distal pulses.  Exam reveals no gallop and no friction rub.   No murmur heard. Pulmonary/Chest: Effort normal and breath sounds normal. No respiratory distress. She has no wheezes. She has no rales. She exhibits no tenderness. Right breast exhibits no inverted nipple, no mass, no nipple discharge, no skin change and no tenderness. Left breast exhibits no inverted nipple, no mass, no nipple discharge, no skin change and no tenderness. Breasts are symmetrical.  Abdominal: Soft. Bowel sounds are normal. She exhibits no distension and no mass. There is no tenderness. There is no rebound and no guarding. Hernia confirmed negative in the right inguinal area and confirmed negative in the left inguinal area.  Genitourinary: Rectum normal, vagina normal and uterus normal. No breast swelling, tenderness, discharge or bleeding. Pelvic exam was performed with patient supine. There is no rash, tenderness, lesion or injury on the right labia. There is no rash, tenderness, lesion or injury on the left labia. Cervix exhibits no motion tenderness, no discharge and no friability. Right adnexum displays no mass, no tenderness and no  fullness. Left adnexum displays no mass, no tenderness and no fullness. No erythema, tenderness or bleeding in the vagina. No signs of injury around the vagina. No vaginal discharge found.  Musculoskeletal: Normal range of motion. She exhibits no edema or tenderness.  Lymphadenopathy:    She has no cervical adenopathy.       Right: No inguinal adenopathy present.       Left: No inguinal adenopathy present.  Neurological: She is alert and oriented to person, place, and time. She has normal reflexes. No cranial nerve deficit. Coordination normal.  Skin: Skin is warm and dry. No rash noted. She is not diaphoretic.  Psychiatric: She has a normal mood and affect. Her behavior is normal. Judgment and thought content normal.  Vitals reviewed.    Depression Screen PHQ 2/9 Scores 03/05/2017 03/04/2016  PHQ - 2 Score 0 0      Assessment & Plan:     Routine Health Maintenance and Physical Exam  Exercise Activities and Dietary recommendations Goals    None       There is no immunization history on file for this patient.  Health Maintenance  Topic Date Due  . Hepatitis C Screening  1960/10/14  . HIV Screening  05/07/1974  . TETANUS/TDAP  05/07/1978  . INFLUENZA VACCINE  03/19/2017  . PAP SMEAR  08/02/2017  . MAMMOGRAM  07/15/2018  . COLONOSCOPY  12/26/2019     Discussed health benefits of physical activity, and encouraged her to engage in regular exercise appropriate for her age and condition.   1. Annual physical exam Normal physical exam today. Will check labs as below and f/u pending lab results. If labs are stable and WNL she will not need to have these rechecked for one year at her next annual physical exam. She is to call the office in the meantime if she has any acute issue, questions or concerns. - CBC with Differential/Platelet - Comprehensive metabolic panel - Hemoglobin A1c - Lipid panel - TSH  2. Screening for breast cancer Breast exam today was normal. There is  no family history of breast cancer. She does perform regular self breast exams. Mammogram was ordered as below. [facility] was given to patient so she may schedule her mammogram at her convenience. - MM DIGITAL SCREENING BILATERAL; Future  3. Screening for  cervical cancer Pap collected today. Will send as below and f/u pending results. - Pap IG and HPV (high risk) DNA detection  4. Screening for HIV (human immunodeficiency virus) - HIV antibody  5. Encounter for hepatitis C screening test for low risk patient - Hepatitis C antibody  6. Screening for thyroid disorder Will check labs as below and f/u pending results. - TSH  7. Screening for diabetes mellitus Will check labs as below and f/u pending results. - Hemoglobin A1c  8. Hypercholesterolemia Will check labs as below and f/u pending results. - Lipid panel   --------------------------------------------------------------------    Margaretann Loveless, PA-C  [facility]

## 2017-03-10 LAB — PAP IG AND HPV HIGH-RISK
HPV, HIGH-RISK: POSITIVE — AB
PAP Smear Comment: 0

## 2017-03-11 ENCOUNTER — Encounter: Payer: Self-pay | Admitting: Physician Assistant

## 2017-03-11 ENCOUNTER — Telehealth: Payer: Self-pay

## 2017-03-11 DIAGNOSIS — R8789 Other abnormal findings in specimens from female genital organs: Secondary | ICD-10-CM

## 2017-03-11 DIAGNOSIS — R87618 Other abnormal cytological findings on specimens from cervix uteri: Secondary | ICD-10-CM

## 2017-03-11 NOTE — Telephone Encounter (Signed)
Patient advised as directed below. [facility] reports [facility]. Referral placed.  Thanks,  -Kiyara Bouffard

## 2017-03-11 NOTE — Telephone Encounter (Signed)
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 It is recommended to refer to GYN and have colposcopy and possible biopsy done. If agreeable I will place referral. Also see if patient has preference of provider. Thanks!

## 2017-03-11 NOTE — Telephone Encounter (Signed)
LMTCB also it looks like patient is has MyChart.   Thanks,  -Joseline

## 2017-03-12 NOTE — Telephone Encounter (Signed)
Noted and referral order signed. Thanks.

## 2017-03-13 LAB — CBC WITH DIFFERENTIAL/PLATELET
Basophils Absolute: 0 10*3/uL (ref 0.0–0.2)
Basos: 0 %
EOS (ABSOLUTE): 0.2 10*3/uL (ref 0.0–0.4)
EOS: 3 %
HEMATOCRIT: 39.3 % (ref 34.0–46.6)
HEMOGLOBIN: 13.1 g/dL (ref 11.1–15.9)
IMMATURE GRANS (ABS): 0 10*3/uL (ref 0.0–0.1)
Immature Granulocytes: 0 %
LYMPHS ABS: 1.8 10*3/uL (ref 0.7–3.1)
Lymphs: 29 %
MCH: 29 pg (ref 26.6–33.0)
MCHC: 33.3 g/dL (ref 31.5–35.7)
MCV: 87 fL (ref 79–97)
Monocytes Absolute: 0.6 10*3/uL (ref 0.1–0.9)
Monocytes: 10 %
NEUTROS ABS: 3.6 10*3/uL (ref 1.4–7.0)
Neutrophils: 58 %
Platelets: 270 10*3/uL (ref 150–379)
RBC: 4.51 x10E6/uL (ref 3.77–5.28)
RDW: 13.4 % (ref 12.3–15.4)
WBC: 6.2 10*3/uL (ref 3.4–10.8)

## 2017-03-13 LAB — COMPREHENSIVE METABOLIC PANEL
ALBUMIN: 4.2 g/dL (ref 3.5–5.5)
ALK PHOS: 76 IU/L (ref 39–117)
ALT: 11 IU/L (ref 0–32)
AST: 19 IU/L (ref 0–40)
Albumin/Globulin Ratio: 1.7 (ref 1.2–2.2)
BILIRUBIN TOTAL: 0.3 mg/dL (ref 0.0–1.2)
BUN/Creatinine Ratio: 19 (ref 9–23)
BUN: 17 mg/dL (ref 6–24)
CHLORIDE: 109 mmol/L — AB (ref 96–106)
CO2: 22 mmol/L (ref 20–29)
CREATININE: 0.9 mg/dL (ref 0.57–1.00)
Calcium: 9 mg/dL (ref 8.7–10.2)
GFR calc non Af Amer: 71 mL/min/{1.73_m2} (ref >59)
GFR, EST AFRICAN AMERICAN: 82 mL/min/{1.73_m2} (ref >59)
GLOBULIN, TOTAL: 2.5 g/dL (ref 1.5–4.5)
Glucose: 95 mg/dL (ref 65–99)
Potassium: 4.2 mmol/L (ref 3.5–5.2)
SODIUM: 145 mmol/L — AB (ref 134–144)
TOTAL PROTEIN: 6.7 g/dL (ref 6.0–8.5)

## 2017-03-13 LAB — LIPID PANEL
Chol/HDL Ratio: 3.5 ratio (ref 0.0–4.4)
Cholesterol, Total: 191 mg/dL (ref 100–199)
HDL: 55 mg/dL (ref >39)
LDL CALC: 101 mg/dL — AB (ref 0–99)
Triglycerides: 174 mg/dL — ABNORMAL HIGH (ref 0–149)
VLDL Cholesterol Cal: 35 mg/dL (ref 5–40)

## 2017-03-13 LAB — HEMOGLOBIN A1C
Est. average glucose Bld gHb Est-mCnc: 108 mg/dL
HEMOGLOBIN A1C: 5.4 % (ref 4.8–5.6)

## 2017-03-13 LAB — TSH: TSH: 3.18 u[IU]/mL (ref 0.450–4.500)

## 2017-03-13 LAB — HIV ANTIBODY (ROUTINE TESTING W REFLEX): HIV Screen 4th Generation wRfx: NONREACTIVE

## 2017-03-13 LAB — HEPATITIS C ANTIBODY: Hep C Virus Ab: <0.1 s/co ratio (ref 0.0–0.9)

## 2017-03-20 ENCOUNTER — Telehealth: Payer: Self-pay | Admitting: Obstetrics & Gynecology

## 2017-03-20 NOTE — Telephone Encounter (Signed)
Patient tis being referred by [facility] for Pap smear abnormality of cervix/human papillomavirus (HPV) positive. lvm for patient to call back to be schedule

## 2017-04-23 ENCOUNTER — Ambulatory Visit: Payer: Self-pay | Admitting: Obstetrics & Gynecology

## 2017-05-13 ENCOUNTER — Ambulatory Visit (INDEPENDENT_AMBULATORY_CARE_PROVIDER_SITE_OTHER): Payer: 59 | Admitting: Physician Assistant

## 2017-05-13 ENCOUNTER — Encounter: Payer: Self-pay | Admitting: Physician Assistant

## 2017-05-13 VITALS — BP 120/80 | HR 80 | Temp 98.0°F | Resp 16 | Ht 65.0 in | Wt 199.2 lb

## 2017-05-13 DIAGNOSIS — Z026 Encounter for examination for insurance purposes: Secondary | ICD-10-CM | POA: Diagnosis not present

## 2017-05-13 DIAGNOSIS — Z23 Encounter for immunization: Secondary | ICD-10-CM | POA: Diagnosis not present

## 2017-05-13 DIAGNOSIS — Z6833 Body mass index (BMI) 33.0-33.9, adult: Secondary | ICD-10-CM

## 2017-05-13 NOTE — Patient Instructions (Signed)

## 2017-05-13 NOTE — Progress Notes (Addendum)
Patient: Kayla English Female    DOB: [birth year]   56 y.o.   MRN: [id number] Visit Date: 05/13/2017  Today's Provider: Margaretann Loveless, PA-C   No chief complaint on file.  Subjective:    HPI Patient is here today to get appeal form filled out. She did not meet goal on her BMI. She is currently walking dogs twice daily 3-4 days per week and is getting ready to start a bootcamp class as well. She has not been dieting or portion controlling.     No Known Allergies   Current Outpatient Prescriptions:  .  acetaminophen (TYLENOL) 325 MG tablet, Take by mouth. Reported on 03/04/2016, Disp: , Rfl:  .  Calcium Carbonate-Vitamin D (CALCIUM-VITAMIN D) 500-200 MG-UNIT tablet, Take by mouth., Disp: , Rfl:  .  conjugated estrogens (PREMARIN) vaginal cream, Place 1 Applicatorful vaginally daily. Apply 0.5mg  (pea-sized amount)  just inside the vaginal introitus with a finger-tip every night for two weeks and then Monday, Wednesday and Friday nights., Disp: 30 g, Rfl: 12 .  DULoxetine (CYMBALTA) 60 MG capsule, TAKE 1 CAPSULE BY MOUTH  DAILY, Disp: 90 capsule, Rfl: 1 .  ibuprofen (ADVIL,MOTRIN) 400 MG tablet, Take 400 mg by mouth every 6 (six) hours as needed., Disp: , Rfl:  .  isometheptene-acetaminophen-dichloralphenazone (MIDRIN) 65-100-325 MG capsule, Take by mouth. Reported on 03/04/2016, Disp: , Rfl:  .  meloxicam (MOBIC) 15 MG tablet, Take 15 mg by mouth as needed for pain., Disp: , Rfl:  .  Multiple Vitamin (MULTI-VITAMINS) TABS, Take by mouth., Disp: , Rfl:  .  omeprazole (PRILOSEC) 20 MG capsule, Take 1 capsule (20 mg total) by mouth daily., Disp: 90 capsule, Rfl: 3 .  Probiotic Product (PROBIOTIC & ACIDOPHILUS EX ST PO), Take by mouth., Disp: , Rfl:  .  SUMAtriptan (IMITREX) 100 MG tablet, Take 100 mg by mouth every 2 (two) hours as needed for migraine. May repeat in 2 hours if headache persists or recurs., Disp: , Rfl:  .  topiramate (TOPAMAX) 100 MG tablet, Take by mouth., Disp: ,  Rfl:  .  Vitamin D, Cholecalciferol, 1000 UNITS CAPS, Take by mouth., Disp: , Rfl:   Review of Systems  Constitutional: Negative.   HENT: Negative.   Respiratory: Negative.   Cardiovascular: Negative.   Gastrointestinal: Negative.   Genitourinary: Negative.   Neurological: Negative.   Psychiatric/Behavioral: Negative.     Social History  Substance Use Topics  . Smoking status: Never Smoker  . Smokeless tobacco: Never Used  . Alcohol use No   Objective:   BP 120/80 (BP Location: Left Arm, Patient Position: Sitting, Cuff Size: Normal)   Pulse 80   Temp 98 F (36.7 C) (Oral)   Resp 16   Ht  (1.651 m)   Wt 199 lb 3.2 oz (90.4 kg)   BMI 33.15 kg/m    Physical Exam  Constitutional: She appears well-developed and well-nourished. No distress.  Neck: Normal range of motion. Neck supple.  Cardiovascular: Normal rate, regular rhythm and normal heart sounds.  Exam reveals no gallop and no friction rub.   No murmur heard. Pulmonary/Chest: Effort normal and breath sounds normal. No respiratory distress. She has no wheezes. She has no rales.  Skin: She is not diaphoretic.  Vitals reviewed.      Assessment & Plan:     1. BMI 33.0-33.9,adult Discussed portion control with calorie counting using a food diary. She has used [employer] in the past and is willing  to restart. Advised her to stick to 1200-1400 calories per day. Continue exercise as she is already doing. Appeal form completed for patient.  2. Influenza vaccine needed Flu vaccine given today without complication. Patient sat upright for 15 minutes to check for adverse reaction before being released. - Flu Vaccine QUAD 36+ mos IM       Margaretann Loveless, PA-C  [facility]

## 2017-05-22 ENCOUNTER — Ambulatory Visit (INDEPENDENT_AMBULATORY_CARE_PROVIDER_SITE_OTHER): Payer: 59 | Admitting: Obstetrics & Gynecology

## 2017-05-22 ENCOUNTER — Encounter: Payer: Self-pay | Admitting: Obstetrics & Gynecology

## 2017-05-22 VITALS — BP 120/80 | HR 102 | Ht 64.0 in | Wt 199.0 lb

## 2017-05-22 DIAGNOSIS — R87610 Atypical squamous cells of undetermined significance on cytologic smear of cervix (ASC-US): Secondary | ICD-10-CM

## 2017-05-22 DIAGNOSIS — R87810 Cervical high risk human papillomavirus (HPV) DNA test positive: Secondary | ICD-10-CM | POA: Diagnosis not present

## 2017-05-22 NOTE — Progress Notes (Signed)
Referring Provider:  Fenton Malling, PA  HPI:  Kayla English is a 56 y.o.  G1P1001  who presents today for evaluation and management of abnormal cervical cytology.    Dysplasia History:  ASCUS, + HPV.  No prior hx. No recent change in health, immune system, partner  ROS:  Pertinent items are noted in HPI.  OB History  Gravida Para Term Preterm AB Living  1 1 1     1   SAB TAB Ectopic Multiple Live Births               # Outcome Date GA Lbr Len/2nd Weight Sex Delivery Anes PTL Lv  1 Term               Past Medical History:  Diagnosis Date  . Avitaminosis D   . Back pain   . Breast nodule   . Cerumen impaction   . Depression   . Fatigue   . Fibrocystic breast   . Fibromyalgia   . Headache   . Insomnia   . Irregular menses   . Migraine   . Osteoarthritis   . Pelvic pain in female   . Pelvic pain in female   . Polycystic ovaries   . Rash   . Sinusitis   . Snoring   . TMJ arthritis   . Transient adjustment reaction with anxiety   . Trochanteric bursitis   . Urinary frequency   . UTI (lower urinary tract infection)   . Weight gain     Past Surgical History:  Procedure Laterality Date  . CESAREAN SECTION  1977  . neck disc sugery  2012  . SHOULDER SURGERY  2009    SOCIAL HISTORY: History  Alcohol Use No   History  Drug Use No     Family History  Problem Relation Age of Onset  . Hypertension Brother   . Ovarian cancer Paternal Grandmother   . Prostate cancer Paternal Grandfather     ALLERGIES:  Patient has no known allergies.  Current Outpatient Prescriptions on File Prior to Visit  Medication Sig Dispense Refill  . acetaminophen (TYLENOL) 325 MG tablet Take by mouth. Reported on 03/04/2016    . Calcium Carbonate-Vitamin D (CALCIUM-VITAMIN D) 500-200 MG-UNIT tablet Take by mouth.    . conjugated estrogens (PREMARIN) vaginal cream Place 1 Applicatorful vaginally daily. Apply 0.22m (pea-sized amount)  just inside the vaginal introitus with a  finger-tip every night for two weeks and then Monday, Wednesday and Friday nights. 30 g 12  . DULoxetine (CYMBALTA) 60 MG capsule TAKE 1 CAPSULE BY MOUTH  DAILY 90 capsule 1  . ibuprofen (ADVIL,MOTRIN) 400 MG tablet Take 400 mg by mouth every 6 (six) hours as needed.    . isometheptene-acetaminophen-dichloralphenazone (MIDRIN) 65-100-325 MG capsule Take by mouth. Reported on 03/04/2016    . meloxicam (MOBIC) 15 MG tablet Take 15 mg by mouth as needed for pain.    . Multiple Vitamin (MULTI-VITAMINS) TABS Take by mouth.    .Marland Kitchenomeprazole (PRILOSEC) 20 MG capsule Take 1 capsule (20 mg total) by mouth daily. 90 capsule 3  . Probiotic Product (PROBIOTIC & ACIDOPHILUS EX ST PO) Take by mouth.    . SUMAtriptan (IMITREX) 100 MG tablet Take 100 mg by mouth every 2 (two) hours as needed for migraine. May repeat in 2 hours if headache persists or recurs.    . topiramate (TOPAMAX) 100 MG tablet Take by mouth.    . Vitamin D, Cholecalciferol, 1000 UNITS CAPS Take  by mouth.     No current facility-administered medications on file prior to visit.     Physical Exam: -Vitals:  BP 120/80   Pulse (!) 102   Ht 5' 4"  (1.626 m)   Wt 199 lb (90.3 kg)   BMI 34.16 kg/m  GEN: WD, WN, NAD.  A+ O x 3, good mood and affect. ABD:  NT, ND.  Soft, no masses.  No hernias noted.   Pelvic:   Vulva: Normal appearance.  No lesions.  Vagina: No lesions or abnormalities noted.  Support: Normal pelvic support.  Urethra No masses tenderness or scarring.  Meatus Normal size without lesions or prolapse.  Cervix: See below.  Anus: Normal exam.  No lesions.  Perineum: Normal exam.  No lesions.        Bimanual   Uterus: Normal size.  Non-tender.  Mobile.  AV.  Adnexae: No masses.  Non-tender to palpation.  Cul-de-sac: Negative for abnormality.   PROCEDURE: 1.  Urine Pregnancy Test:  not done 2.  Colposcopy performed with 4% acetic acid after verbal consent obtained                                         -Aceto-white  Lesions Location(s): none.              -Biopsy performed at 6, 12 o'clock               -ECC indicated and performed: Yes.       -Biopsy sites made hemostatic with pressure, AgNO3, and/or Monsel's solution   -Satisfactory colposcopy: Yes.      -Evidence of Invasive cervical CA :  NO  ASSESSMENT:  Kayla English is a 56 y.o. G1P1001 here for  1. ASCUS with positive high risk HPV cervical   .  PLAN: 1.  I discussed the grading system of pap smears and HPV high risk viral types.  We will discuss and base management after colpo results return. 2. Follow up PAP 6 months, vs intervention if high grade dysplasia identified 3. Treatment of persistantly abnormal PAP smears and cervical dysplasia, even mild, is discussed w pt today in detail, as well as the pros and cons of Cryo and LEEP procedures. Will consider and discuss after results. 4. Also considering BRCA testing due to [place] [facility] cancer  She presents with a significant personal and/or family history of ovarian cancer paternal grandmother dx age 59 or so. Details of which can be found in her medical/family history. She does not have a previously identified BRCA and Lynch syndrome mutation in her family. Due to her personal and/or family history of cancer she is a candidate for the [facility] test(s).    Risk for cancer, genetic susceptibility discussed.  Patient has undecided on gene testing.  Discussed BRCA as well as Lynch syndrome and other cancer risk assessments available based on her family history and personal history. Pros and cons of testing discussed.   Barnett Applebaum, MD, [facility], [facility] 05/22/2017  2:37 PM

## 2017-05-22 NOTE — Patient Instructions (Signed)

## 2017-05-23 NOTE — Addendum Note (Signed)
Addended by: Nadara Mustard on: 05/23/2017 11:31 AM   Modules accepted: Orders

## 2017-05-27 LAB — PATHOLOGY

## 2017-06-09 ENCOUNTER — Encounter: Payer: Self-pay | Admitting: Obstetrics and Gynecology

## 2017-06-28 ENCOUNTER — Other Ambulatory Visit: Payer: Self-pay | Admitting: Physician Assistant

## 2017-06-28 DIAGNOSIS — F32A Depression, unspecified: Secondary | ICD-10-CM

## 2017-06-28 DIAGNOSIS — F329 Major depressive disorder, single episode, unspecified: Secondary | ICD-10-CM

## 2017-08-02 ENCOUNTER — Other Ambulatory Visit: Payer: Self-pay | Admitting: Physician Assistant

## 2017-08-02 DIAGNOSIS — F320 Major depressive disorder, single episode, mild: Secondary | ICD-10-CM

## 2017-08-04 NOTE — Telephone Encounter (Signed)
Pharmacy requesting refills. Thanks!  

## 2017-08-26 ENCOUNTER — Other Ambulatory Visit: Payer: Self-pay | Admitting: Neurology

## 2017-08-26 DIAGNOSIS — R519 Headache, unspecified: Secondary | ICD-10-CM

## 2017-08-26 DIAGNOSIS — R51 Headache: Principal | ICD-10-CM

## 2017-09-03 ENCOUNTER — Ambulatory Visit
Admission: RE | Admit: 2017-09-03 | Discharge: 2017-09-03 | Disposition: A | Discharge status: Home / Self Care | Payer: Managed Care, Other (non HMO) | Source: Ambulatory Visit | Attending: Neurology | Admitting: Neurology

## 2017-09-03 DIAGNOSIS — R51 Headache: Secondary | ICD-10-CM | POA: Diagnosis not present

## 2017-09-03 DIAGNOSIS — R296 Repeated falls: Secondary | ICD-10-CM | POA: Insufficient documentation

## 2017-09-03 DIAGNOSIS — R519 Headache, unspecified: Secondary | ICD-10-CM

## 2017-09-03 MED ORDER — GADOBENATE DIMEGLUMINE 529 MG/ML IV SOLN
18.0000 mL | Freq: Once | INTRAVENOUS | Status: AC | PRN
  Administered 2017-09-03: 18 mL via INTRAVENOUS

## 2017-10-08 ENCOUNTER — Ambulatory Visit: Payer: Managed Care, Other (non HMO)

## 2017-10-08 ENCOUNTER — Encounter: Payer: Self-pay | Admitting: Obstetrics & Gynecology

## 2017-10-17 DIAGNOSIS — Z1371 Encounter for nonprocreative screening for genetic disease carrier status: Secondary | ICD-10-CM

## 2017-10-17 HISTORY — DX: Encounter for nonprocreative screening for genetic disease carrier status: Z13.71

## 2017-10-22 ENCOUNTER — Other Ambulatory Visit: Payer: Self-pay | Admitting: Physician Assistant

## 2017-10-22 ENCOUNTER — Ambulatory Visit
Admission: RE | Admit: 2017-10-22 | Discharge: 2017-10-22 | Disposition: A | Discharge status: Home / Self Care | Payer: Managed Care, Other (non HMO) | Source: Ambulatory Visit | Attending: Physician Assistant | Admitting: Physician Assistant

## 2017-10-22 DIAGNOSIS — Z1231 Encounter for screening mammogram for malignant neoplasm of breast: Secondary | ICD-10-CM | POA: Insufficient documentation

## 2017-10-22 DIAGNOSIS — Z1239 Encounter for other screening for malignant neoplasm of breast: Secondary | ICD-10-CM

## 2017-10-23 ENCOUNTER — Encounter: Payer: Self-pay | Admitting: Obstetrics and Gynecology

## 2017-10-23 ENCOUNTER — Telehealth: Payer: Self-pay

## 2017-10-23 NOTE — Telephone Encounter (Signed)
-----   Message from Margaretann LovelessJennifer M Burnette, [place]PA-C sent at 10/23/2017  4:03 PM EST ----- Normal mammogram. Repeat screening in one year.

## 2017-10-23 NOTE — Telephone Encounter (Signed)
Pt advised.   Thanks,   -Binnie Vonderhaar  

## 2017-11-13 ENCOUNTER — Encounter: Payer: Self-pay | Admitting: Obstetrics and Gynecology

## 2017-11-13 ENCOUNTER — Other Ambulatory Visit: Payer: Self-pay | Admitting: Physician Assistant

## 2017-11-13 DIAGNOSIS — K219 Gastro-esophageal reflux disease without esophagitis: Secondary | ICD-10-CM

## 2017-12-05 ENCOUNTER — Other Ambulatory Visit: Payer: Self-pay | Admitting: Physician Assistant

## 2017-12-05 DIAGNOSIS — F329 Major depressive disorder, single episode, unspecified: Secondary | ICD-10-CM

## 2017-12-05 DIAGNOSIS — F32A Depression, unspecified: Secondary | ICD-10-CM

## 2018-01-07 ENCOUNTER — Other Ambulatory Visit: Payer: Self-pay | Admitting: Physician Assistant

## 2018-01-07 DIAGNOSIS — F320 Major depressive disorder, single episode, mild: Secondary | ICD-10-CM

## 2018-03-11 ENCOUNTER — Encounter: Payer: Self-pay | Admitting: Physician Assistant

## 2018-03-11 ENCOUNTER — Ambulatory Visit (INDEPENDENT_AMBULATORY_CARE_PROVIDER_SITE_OTHER): Payer: Managed Care, Other (non HMO) | Admitting: Physician Assistant

## 2018-03-11 VITALS — BP 120/88 | HR 99 | Temp 98.4°F | Resp 16 | Ht 64.0 in | Wt 194.4 lb

## 2018-03-11 DIAGNOSIS — Z0000 Encounter for general adult medical examination without abnormal findings: Secondary | ICD-10-CM | POA: Diagnosis not present

## 2018-03-11 DIAGNOSIS — Z6833 Body mass index (BMI) 33.0-33.9, adult: Secondary | ICD-10-CM | POA: Insufficient documentation

## 2018-03-11 DIAGNOSIS — Z1239 Encounter for other screening for malignant neoplasm of breast: Secondary | ICD-10-CM

## 2018-03-11 DIAGNOSIS — M797 Fibromyalgia: Secondary | ICD-10-CM | POA: Diagnosis not present

## 2018-03-11 DIAGNOSIS — Z0189 Encounter for other specified special examinations: Secondary | ICD-10-CM

## 2018-03-11 DIAGNOSIS — Z008 Encounter for other general examination: Secondary | ICD-10-CM

## 2018-03-11 DIAGNOSIS — Z1231 Encounter for screening mammogram for malignant neoplasm of breast: Secondary | ICD-10-CM

## 2018-03-11 MED ORDER — DULOXETINE HCL 30 MG PO CPEP: 30.0000 mg | ORAL_CAPSULE | Freq: Every day | ORAL | 1 refills | Status: DC

## 2018-03-11 NOTE — Patient Instructions (Signed)
Health Maintenance for Postmenopausal Women Menopause is a normal process in which your reproductive ability comes to an end. This process happens gradually over a span of months to years, usually between the ages of 22 and 9. Menopause is complete when you have missed 12 consecutive menstrual periods. It is important to talk with your health care provider about some of the most common conditions that affect postmenopausal women, such as heart disease, cancer, and bone loss (osteoporosis). Adopting a healthy lifestyle and getting preventive care can help to promote your health and wellness. Those actions can also lower your chances of developing some of these common conditions. What should I know about menopause? During menopause, you may experience a number of symptoms, such as:  Moderate-to-severe hot flashes.  Night sweats.  Decrease in sex drive.  Mood swings.  Headaches.  Tiredness.  Irritability.  Memory problems.  Insomnia.  Choosing to treat or not to treat menopausal changes is an individual decision that you make with your health care provider. What should I know about hormone replacement therapy and supplements? Hormone therapy products are effective for treating symptoms that are associated with menopause, such as hot flashes and night sweats. Hormone replacement carries certain risks, especially as you become older. If you are thinking about using estrogen or estrogen with progestin treatments, discuss the benefits and risks with your health care provider. What should I know about heart disease and stroke? Heart disease, heart attack, and stroke become more likely as you age. This may be due, in part, to the hormonal changes that your body experiences during menopause. These can affect how your body processes dietary fats, triglycerides, and cholesterol. Heart attack and stroke are both medical emergencies. There are many things that you can do to help prevent heart disease  and stroke:  Have your blood pressure checked at least every 1-2 years. High blood pressure causes heart disease and increases the risk of stroke.  If you are 53-22 years old, ask your health care provider if you should take aspirin to prevent a heart attack or a stroke.  Do not use any tobacco products, including cigarettes, chewing tobacco, or electronic cigarettes. If you need help quitting, ask your health care provider.  It is important to eat a healthy diet and maintain a healthy weight. ? Be sure to include plenty of vegetables, fruits, low-fat dairy products, and lean protein. ? Avoid eating foods that are high in solid fats, added sugars, or salt (sodium).  Get regular exercise. This is one of the most important things that you can do for your health. ? Try to exercise for at least 150 minutes each week. The type of exercise that you do should increase your heart rate and make you sweat. This is known as moderate-intensity exercise. ? Try to do strengthening exercises at least twice each week. Do these in addition to the moderate-intensity exercise.  Know your numbers.Ask your health care provider to check your cholesterol and your blood glucose. Continue to have your blood tested as directed by your health care provider.  What should I know about cancer screening? There are several types of cancer. Take the following steps to reduce your risk and to catch any cancer development as early as possible. Breast Cancer  Practice breast self-awareness. ? This means understanding how your breasts normally appear and feel. ? It also means doing regular breast self-exams. Let your health care provider know about any changes, no matter how small.  If you are 40  or older, have a clinician do a breast exam (clinical breast exam or CBE) every year. Depending on your age, family history, and medical history, it may be recommended that you also have a yearly breast X-ray (mammogram).  If you  have a family history of breast cancer, talk with your health care provider about genetic screening.  If you are at high risk for breast cancer, talk with your health care provider about having an MRI and a mammogram every year.  Breast cancer (BRCA) gene test is recommended for women who have family members with BRCA-related cancers. Results of the assessment will determine the need for genetic counseling and BRCA1 and for BRCA2 testing. BRCA-related cancers include these types: ? Breast. This occurs in males or females. ? Ovarian. ? Tubal. This may also be called fallopian tube cancer. ? Cancer of the abdominal or pelvic lining (peritoneal cancer). ? Prostate. ? Pancreatic.  Cervical, Uterine, and Ovarian Cancer Your health care provider may recommend that you be screened regularly for cancer of the pelvic organs. These include your ovaries, uterus, and vagina. This screening involves a pelvic exam, which includes checking for microscopic changes to the surface of your cervix (Pap test).  For women ages 21-65, health care providers may recommend a pelvic exam and a Pap test every three years. For women ages 79-65, they may recommend the Pap test and pelvic exam, combined with testing for human papilloma virus (HPV), every five years. Some types of HPV increase your risk of cervical cancer. Testing for HPV may also be done on women of any age who have unclear Pap test results.  Other health care providers may not recommend any screening for nonpregnant women who are considered low risk for pelvic cancer and have no symptoms. Ask your health care provider if a screening pelvic exam is right for you.  If you have had past treatment for cervical cancer or a condition that could lead to cancer, you need Pap tests and screening for cancer for at least 20 years after your treatment. If Pap tests have been discontinued for you, your risk factors (such as having a new sexual partner) need to be  reassessed to determine if you should start having screenings again. Some women have medical problems that increase the chance of getting cervical cancer. In these cases, your health care provider may recommend that you have screening and Pap tests more often.  If you have a family history of uterine cancer or ovarian cancer, talk with your health care provider about genetic screening.  If you have vaginal bleeding after reaching menopause, tell your health care provider.  There are currently no reliable tests available to screen for ovarian cancer.  Lung Cancer Lung cancer screening is recommended for adults 69-62 years old who are at high risk for lung cancer because of a history of smoking. A yearly low-dose CT scan of the lungs is recommended if you:  Currently smoke.  Have a history of at least 30 pack-years of smoking and you currently smoke or have quit within the past 15 years. A pack-year is smoking an average of one pack of cigarettes per day for one year.  Yearly screening should:  Continue until it has been 15 years since you quit.  Stop if you develop a health problem that would prevent you from having lung cancer treatment.  Colorectal Cancer  This type of cancer can be detected and can often be prevented.  Routine colorectal cancer screening usually begins at  age 59 and continues through age 8.  If you have risk factors for colon cancer, your health care provider may recommend that you be screened at an earlier age.  If you have a family history of colorectal cancer, talk with your health care provider about genetic screening.  Your health care provider may also recommend using home test kits to check for hidden blood in your stool.  A small camera at the end of a tube can be used to examine your colon directly (sigmoidoscopy or colonoscopy). This is done to check for the earliest forms of colorectal cancer.  Direct examination of the colon should be repeated every  5-10 years until age 50. However, if early forms of precancerous polyps or small growths are found or if you have a family history or genetic risk for colorectal cancer, you may need to be screened more often.  Skin Cancer  Check your skin from head to toe regularly.  Monitor any moles. Be sure to tell your health care provider: ? About any new moles or changes in moles, especially if there is a change in a mole's shape or color. ? If you have a mole that is larger than the size of a pencil eraser.  If any of your family members has a history of skin cancer, especially at a young age, talk with your health care provider about genetic screening.  Always use sunscreen. Apply sunscreen liberally and repeatedly throughout the day.  Whenever you are outside, protect yourself by wearing long sleeves, pants, a wide-brimmed hat, and sunglasses.  What should I know about osteoporosis? Osteoporosis is a condition in which bone destruction happens more quickly than new bone creation. After menopause, you may be at an increased risk for osteoporosis. To help prevent osteoporosis or the bone fractures that can happen because of osteoporosis, the following is recommended:  If you are 60-63 years old, get at least 1,000 mg of calcium and at least 600 mg of vitamin D per day.  If you are older than age 24 but younger than age 60, get at least 1,200 mg of calcium and at least 600 mg of vitamin D per day.  If you are older than age 49, get at least 1,200 mg of calcium and at least 800 mg of vitamin D per day.  Smoking and excessive alcohol intake increase the risk of osteoporosis. Eat foods that are rich in calcium and vitamin D, and do weight-bearing exercises several times each week as directed by your health care provider. What should I know about how menopause affects my mental health? Depression may occur at any age, but it is more common as you become older. Common symptoms of depression  include:  Low or sad mood.  Changes in sleep patterns.  Changes in appetite or eating patterns.  Feeling an overall lack of motivation or enjoyment of activities that you previously enjoyed.  Frequent crying spells.  Talk with your health care provider if you think that you are experiencing depression. What should I know about immunizations? It is important that you get and maintain your immunizations. These include:  Tetanus, diphtheria, and pertussis (Tdap) booster vaccine.  Influenza every year before the flu season begins.  Pneumonia vaccine.  Shingles vaccine.  Your health care provider may also recommend other immunizations. This information is not intended to replace advice given to you by your health care provider. Make sure you discuss any questions you have with your health care provider. Document Released: 09/27/2005  Document Revised: 02/23/2016 Document Reviewed: 05/09/2015 Elsevier Interactive Patient Education  [employer].

## 2018-03-11 NOTE — Progress Notes (Signed)
Patient: Kayla English, Female    DOB: 02/10/1961, 57 y.o.   MRN: [id number] Visit Date: 03/11/2018  Today's Provider: Mar Daring, PA-C   Chief Complaint  Patient presents with  . Annual Exam   Subjective:    Annual physical exam Kayla English is a 57 y.o. female who presents today for health maintenance and complete physical. She feels fairly well. She reports exercising. She reports she is sleeping poorly. Sleeping poorly secondary to fibromyalgia pains in legs when she rolls over in bed. Reports she only sleeps a good 3-4 hours per night and the rest is spent tossing and turning. Does have zolpidem Rx she takes only prn.  Pap:03/05/2017 abnormal with atypical squamous cells of undetermined significance (ASCUS) and HPV positive. Patient referred to GYN. Had colposcopy in 05/2017 and was to have f/u in 6 months. She will call to schedule.  Colonoscopy:12/25/2009 Mammogram:10/22/17 BI-RADS CATEGORY  1: Negative -----------------------------------------------------------------   Review of Systems  Constitutional: Positive for fatigue.  HENT: Negative.   Eyes: Negative.   Respiratory: Negative.   Cardiovascular: Negative.   Gastrointestinal: Negative.   Endocrine: Positive for polyuria.  Genitourinary: Positive for frequency.  Musculoskeletal: Positive for back pain, myalgias and neck pain.  Skin: Negative.   Allergic/Immunologic: Negative.   Neurological: Positive for headaches.  Hematological: Bruises/bleeds easily.  Psychiatric/Behavioral: Positive for sleep disturbance.    Social History      She  reports that she has never smoked. She has never used smokeless tobacco. She reports that she does not drink alcohol or use drugs.       Social History   Socioeconomic History  . Marital status: Married    Spouse name: Not on file  . Number of children: Not on file  . Years of education: Not on file  . Highest education level: Not on file    Occupational History  . Not on file  Social Needs  . Financial resource strain: Not on file  . Food insecurity:    Worry: Not on file    Inability: Not on file  . Transportation needs:    Medical: Not on file    Non-medical: Not on file  Tobacco Use  . Smoking status: Never Smoker  . Smokeless tobacco: Never Used  Substance and Sexual Activity  . Alcohol use: No    Alcohol/week: 0.0 oz  . Drug use: No  . Sexual activity: Yes  Lifestyle  . Physical activity:    Days per week: Not on file    Minutes per session: Not on file  . Stress: Not on file  Relationships  . Social connections:    Talks on phone: Not on file    Gets together: Not on file    Attends religious service: Not on file    Active member of club or organization: Not on file    Attends meetings of clubs or organizations: Not on file    Relationship status: Not on file  Other Topics Concern  . Not on file  Social History Narrative  . Not on file    Past Medical History:  Diagnosis Date  . Avitaminosis D   . Back pain   . BRCA negative 10/2017   MyRisk neg; riskscore=6.4%/IBIS=9.6%  . Breast nodule   . Cerumen impaction   . Depression   . Family history of ovarian cancer    10/18 cancer genetic testing letter sent  . Fatigue   . Fibrocystic breast   .  Fibromyalgia   . Headache   . Insomnia   . Irregular menses   . Migraine   . Osteoarthritis   . Pelvic pain in female   . Pelvic pain in female   . Polycystic ovaries   . Rash   . Sinusitis   . Snoring   . TMJ arthritis   . Transient adjustment reaction with anxiety   . Trochanteric bursitis   . Urinary frequency   . UTI (lower urinary tract infection)   . Weight gain      Patient Active Problem List   Diagnosis Date Noted  . Adjustment disorder with anxiety 01/29/2016  . Anxiety 01/29/2016  . Body mass index (BMI) of 28.0-28.9 in adult 01/29/2016  . Breast lump 01/29/2016  . Cerumen impaction 01/29/2016  . History of chicken pox  01/29/2016  . Dizziness 01/29/2016  . Fatigue 01/29/2016  . Bloodgood disease 01/29/2016  . Fibrositis 01/29/2016  . Cephalalgia 01/29/2016  . Cannot sleep 01/29/2016  . Headache, migraine 01/29/2016  . Arthritis, degenerative 01/29/2016  . Adnexal pain 01/29/2016  . Bilateral polycystic ovarian syndrome 01/29/2016  . Post menopausal syndrome 01/29/2016  . Cutaneous eruption 01/29/2016  . Snores 01/29/2016  . Arthritis of temporomandibular joint 01/29/2016  . Bursitis, trochanteric 01/29/2016  . Severe obstructive sleep apnea 01/29/2016  . Avitaminosis D 01/29/2016  . Abnormal weight gain 01/29/2016  . Depression 10/04/2015  . Back pain 02/22/2015    Past Surgical History:  Procedure Laterality Date  . CESAREAN SECTION  1977  . neck disc sugery  2012  . SHOULDER SURGERY  2009    Family History        Family Status  Relation Name Status  . Brother  (Not Specified)  . PGM  (Not Specified)  . PGF  (Not Specified)  . MGF  (Not Specified)        Her family history includes Hypertension in her brother; Lung cancer in her maternal grandfather; Ovarian cancer (age of onset: 14) in her paternal grandmother; Prostate cancer in her paternal grandfather.      No Known Allergies   Current Outpatient Medications:  .  Calcium Carbonate-Vitamin D (CALCIUM-VITAMIN D) 500-200 MG-UNIT tablet, Take by mouth., Disp: , Rfl:  .  DULoxetine (CYMBALTA) 60 MG capsule, TAKE 1 CAPSULE BY MOUTH  DAILY, Disp: 90 capsule, Rfl: 1 .  methocarbamol (ROBAXIN) 500 MG tablet, , Disp: , Rfl:  .  Multiple Vitamin (MULTI-VITAMINS) TABS, Take by mouth., Disp: , Rfl:  .  omeprazole (PRILOSEC) 20 MG capsule, TAKE 1 CAPSULE BY MOUTH  DAILY, Disp: 90 capsule, Rfl: 2 .  SUMAtriptan (IMITREX) 100 MG tablet, Take 100 mg by mouth every 2 (two) hours as needed for migraine. May repeat in 2 hours if headache persists or recurs., Disp: , Rfl:  .  topiramate (TOPAMAX) 100 MG tablet, Take by mouth., Disp: , Rfl:  .   Vitamin D, Cholecalciferol, 1000 UNITS CAPS, Take by mouth., Disp: , Rfl:  .  acetaminophen (TYLENOL) 325 MG tablet, Take by mouth. Reported on 03/04/2016, Disp: , Rfl:  .  conjugated estrogens (PREMARIN) vaginal cream, Place 1 Applicatorful vaginally daily. Apply 0.79m (pea-sized amount)  just inside the vaginal introitus with a finger-tip every night for two weeks and then Monday, Wednesday and Friday nights. (Patient not taking: Reported on 03/11/2018), Disp: 30 g, Rfl: 12 .  ibuprofen (ADVIL,MOTRIN) 400 MG tablet, Take 400 mg by mouth every 6 (six) hours as needed., Disp: , Rfl:  .  isometheptene-acetaminophen-dichloralphenazone (MIDRIN) 65-100-325 MG capsule, Take by mouth. Reported on 03/04/2016, Disp: , Rfl:  .  meloxicam (MOBIC) 15 MG tablet, Take 15 mg by mouth as needed for pain., Disp: , Rfl:  .  Probiotic Product (PROBIOTIC & ACIDOPHILUS EX ST PO), Take by mouth., Disp: , Rfl:  .  sertraline (ZOLOFT) 100 MG tablet, TAKE 1 TABLET BY MOUTH  DAILY (Patient not taking: Reported on 03/11/2018), Disp: 90 tablet, Rfl: 1   Patient Care Team: Mar Daring, PA-C as PCP - General (Family Medicine)      Objective:   Vitals: BP 120/88 (BP Location: Left Arm, Patient Position: Sitting, Cuff Size: Normal)   Pulse 99   Temp 98.4 F (36.9 C) (Oral)   Resp 16   Ht _0  (1.626 m)   Wt 194 lb 6.4 oz (88.2 kg)   BMI 33.37 kg/m    Vitals:   03/11/18 0924  BP: 120/88  Pulse: 99  Resp: 16  Temp: 98.4 F (36.9 C)  TempSrc: Oral  Weight: 194 lb 6.4 oz (88.2 kg)  Height: _1  (1.626 m)     Physical Exam  Constitutional: She is oriented to person, place, and time. She appears well-developed and well-nourished. No distress.  HENT:  Head: Normocephalic and atraumatic.  Right Ear: External ear normal.  Left Ear: External ear normal.  Nose: Nose normal.  Mouth/Throat: Oropharynx is clear and moist. No oropharyngeal exudate.  Eyes: Pupils are equal, round, and reactive to light.  Conjunctivae and EOM are normal. Right eye exhibits no discharge. Left eye exhibits no discharge. No scleral icterus.  Neck: Normal range of motion. Neck supple. No JVD present. No tracheal deviation present. No thyromegaly present.  Cardiovascular: Normal rate, regular rhythm, normal heart sounds and intact distal pulses. Exam reveals no gallop and no friction rub.  No murmur heard. Pulmonary/Chest: Effort normal and breath sounds normal. No respiratory distress. She has no wheezes. She has no rales. She exhibits no tenderness.  Abdominal: Soft. Bowel sounds are normal. She exhibits no distension and no mass. There is no tenderness. There is no rebound and no guarding.  Musculoskeletal: Normal range of motion. She exhibits no edema or tenderness.  Lymphadenopathy:    She has no cervical adenopathy.  Neurological: She is alert and oriented to person, place, and time.  Skin: Skin is warm and dry. No rash noted. She is not diaphoretic.  Psychiatric: She has a normal mood and affect. Her behavior is normal. Judgment and thought content normal.  Vitals reviewed.    Depression Screen PHQ 2/9 Scores 03/11/2018 03/05/2017 03/04/2016  PHQ - 2 Score 0 0 0      Assessment & Plan:     Routine Health Maintenance and Physical Exam  Exercise Activities and Dietary recommendations Goals    None      Immunization History  Administered Date(s) Administered  . Influenza,inj,Quad PF,6+ Mos 05/13/2017    Health Maintenance  Topic Date Due  . TETANUS/TDAP  05/07/1978  . INFLUENZA VACCINE  03/19/2018  . MAMMOGRAM  10/23/2019  . COLONOSCOPY  12/26/2019  . PAP SMEAR  03/05/2020  . Hepatitis C Screening  Completed  . HIV Screening  Completed     Discussed health benefits of physical activity, and encouraged her to engage in regular exercise appropriate for her age and condition.    1. Annual physical exam Normal physical exam today. Will check labs as below and f/u pending lab results. If  labs are stable and WNL  she will not need to have these rechecked for one year at her next annual physical exam. She is to call the office in the meantime if she has any acute issue, questions or concerns. - CBC with Differential/Platelet - Comprehensive metabolic panel - Hemoglobin A1c - Lipid panel - TSH  2. Breast cancer screening Mammogram normal in 10/2017.   3. Fibromyalgia Increase duloxetine to 70m daily.  - DULoxetine (CYMBALTA) 30 MG capsule; Take 1 capsule (30 mg total) by mouth daily. Take with 663mcapsule daily for total of 9052mDispense: 90 capsule; Refill: 1  4. BMI 33.0-33.9,adult Counseled patient on healthy lifestyle modifications including dieting and exercise.   5. Encounter for biometric screening Will check labs as below and f/u pending results. - Hemoglobin A1c - Lipid panel  --------------------------------------------------------------------    JenMar DaringA-C  [place]

## 2018-03-11 NOTE — Addendum Note (Signed)
Addended by: Margaretann LovelessBURNETTE, JENNIFER M on: 03/11/2018 10:35 AM   Modules accepted: Orders

## 2018-03-12 ENCOUNTER — Telehealth: Payer: Self-pay

## 2018-03-12 LAB — COMPREHENSIVE METABOLIC PANEL
ALK PHOS: 77 IU/L (ref 39–117)
ALT: 11 IU/L (ref 0–32)
AST: 16 IU/L (ref 0–40)
Albumin/Globulin Ratio: 1.5 (ref 1.2–2.2)
Albumin: 4 g/dL (ref 3.5–5.5)
BUN/Creatinine Ratio: 21 (ref 9–23)
BUN: 18 mg/dL (ref 6–24)
Bilirubin Total: <0.2 mg/dL (ref 0.0–1.2)
CHLORIDE: 110 mmol/L — AB (ref 96–106)
CO2: 21 mmol/L (ref 20–29)
Calcium: 9.2 mg/dL (ref 8.7–10.2)
Creatinine, Ser: 0.85 mg/dL (ref 0.57–1.00)
GFR calc Af Amer: 87 mL/min/{1.73_m2} (ref >59)
GFR, EST NON AFRICAN AMERICAN: 76 mL/min/{1.73_m2} (ref >59)
GLOBULIN, TOTAL: 2.6 g/dL (ref 1.5–4.5)
Glucose: 94 mg/dL (ref 65–99)
POTASSIUM: 4.4 mmol/L (ref 3.5–5.2)
Sodium: 143 mmol/L (ref 134–144)
TOTAL PROTEIN: 6.6 g/dL (ref 6.0–8.5)

## 2018-03-12 LAB — CBC WITH DIFFERENTIAL/PLATELET
Basophils Absolute: 0.1 10*3/uL (ref 0.0–0.2)
Basos: 1 %
EOS (ABSOLUTE): 0.2 10*3/uL (ref 0.0–0.4)
Eos: 2 %
HEMATOCRIT: 39.5 % (ref 34.0–46.6)
Hemoglobin: 12.6 g/dL (ref 11.1–15.9)
IMMATURE GRANS (ABS): 0 10*3/uL (ref 0.0–0.1)
Immature Granulocytes: 0 %
LYMPHS ABS: 1.9 10*3/uL (ref 0.7–3.1)
LYMPHS: 26 %
MCH: 27.8 pg (ref 26.6–33.0)
MCHC: 31.9 g/dL (ref 31.5–35.7)
MCV: 87 fL (ref 79–97)
MONOCYTES: 10 %
Monocytes Absolute: 0.7 10*3/uL (ref 0.1–0.9)
NEUTROS ABS: 4.3 10*3/uL (ref 1.4–7.0)
Neutrophils: 61 %
Platelets: 280 10*3/uL (ref 150–450)
RBC: 4.53 x10E6/uL (ref 3.77–5.28)
RDW: 13.3 % (ref 12.3–15.4)
WBC: 7.1 10*3/uL (ref 3.4–10.8)

## 2018-03-12 LAB — LIPID PANEL
CHOLESTEROL TOTAL: 202 mg/dL — AB (ref 100–199)
Chol/HDL Ratio: 3.5 ratio (ref 0.0–4.4)
HDL: 58 mg/dL (ref >39)
LDL Calculated: 115 mg/dL — ABNORMAL HIGH (ref 0–99)
Triglycerides: 147 mg/dL (ref 0–149)
VLDL Cholesterol Cal: 29 mg/dL (ref 5–40)

## 2018-03-12 LAB — TSH: TSH: 2.12 u[IU]/mL (ref 0.450–4.500)

## 2018-03-12 LAB — HEMOGLOBIN A1C
ESTIMATED AVERAGE GLUCOSE: 114 mg/dL
Hgb A1c MFr Bld: 5.6 % (ref 4.8–5.6)

## 2018-03-12 NOTE — Telephone Encounter (Signed)
-----   Message from Margaretann LovelessJennifer M Burnette, PA-C sent at 03/12/2018  8:48 AM EDT ----- Cholesterol up just slightly from last year. All other labs are normal and stable. Recommend working on lifestyle modifications as discussed in the OV.

## 2018-03-12 NOTE — Telephone Encounter (Signed)
Patient advised as directed below.  Thanks,  -Seleste Tallman 

## 2018-05-28 ENCOUNTER — Other Ambulatory Visit: Payer: Self-pay | Admitting: Physician Assistant

## 2018-05-28 DIAGNOSIS — F339 Major depressive disorder, recurrent, unspecified: Secondary | ICD-10-CM

## 2018-06-27 ENCOUNTER — Other Ambulatory Visit: Payer: Self-pay | Admitting: Physician Assistant

## 2018-06-27 DIAGNOSIS — K219 Gastro-esophageal reflux disease without esophagitis: Secondary | ICD-10-CM

## 2018-07-21 ENCOUNTER — Other Ambulatory Visit: Payer: Self-pay | Admitting: Physician Assistant

## 2018-07-21 DIAGNOSIS — F329 Major depressive disorder, single episode, unspecified: Secondary | ICD-10-CM

## 2018-07-21 DIAGNOSIS — F32A Depression, unspecified: Secondary | ICD-10-CM

## 2018-07-29 ENCOUNTER — Other Ambulatory Visit: Payer: Self-pay | Admitting: Physician Assistant

## 2018-07-29 DIAGNOSIS — M797 Fibromyalgia: Secondary | ICD-10-CM

## 2018-11-14 ENCOUNTER — Other Ambulatory Visit: Payer: Self-pay | Admitting: Physician Assistant

## 2018-11-14 DIAGNOSIS — F339 Major depressive disorder, recurrent, unspecified: Secondary | ICD-10-CM

## 2019-01-07 ENCOUNTER — Other Ambulatory Visit: Payer: Self-pay | Admitting: Physician Assistant

## 2019-01-07 DIAGNOSIS — M797 Fibromyalgia: Secondary | ICD-10-CM

## 2019-03-09 ENCOUNTER — Other Ambulatory Visit: Payer: Self-pay | Admitting: Physician Assistant

## 2019-03-09 DIAGNOSIS — K219 Gastro-esophageal reflux disease without esophagitis: Secondary | ICD-10-CM

## 2019-05-04 ENCOUNTER — Other Ambulatory Visit: Payer: Self-pay | Admitting: Physician Assistant

## 2019-05-04 DIAGNOSIS — M797 Fibromyalgia: Secondary | ICD-10-CM

## 2019-05-04 DIAGNOSIS — F339 Major depressive disorder, recurrent, unspecified: Secondary | ICD-10-CM

## 2019-11-11 ENCOUNTER — Other Ambulatory Visit: Payer: Self-pay | Admitting: Physician Assistant

## 2019-11-11 DIAGNOSIS — K219 Gastro-esophageal reflux disease without esophagitis: Secondary | ICD-10-CM

## 2019-11-11 NOTE — Telephone Encounter (Signed)
Requested  medications are  due for refill today yes  Requested medications are on the active medication list yes  Last refill 09/21/2019  Future visit scheduled no  Last visit 02/2018  Notes to clinic Failed protocol due to no office visit since 2019

## 2019-11-12 NOTE — Telephone Encounter (Signed)
LOV  03/11/18  LRF  03/10/19  90 x 1

## 2019-11-12 NOTE — Telephone Encounter (Signed)
Needs CPE scheduled over next 3 months before more refills given

## 2020-01-31 ENCOUNTER — Other Ambulatory Visit: Payer: Self-pay | Admitting: Physician Assistant

## 2020-01-31 DIAGNOSIS — K219 Gastro-esophageal reflux disease without esophagitis: Secondary | ICD-10-CM

## 2020-01-31 NOTE — Telephone Encounter (Signed)
Requested medications are due for refill today?  Yes  Requested medications are on active medication list?  Yes  Last Refill:   11/12/2019  # 90 with no refills  Future visit scheduled?  No   Notes to Clinic:  Medication failed RX refill protocol due to no valid encounter in the past year.  Last visit with provider was on 03/11/2018.

## 2020-02-16 ENCOUNTER — Other Ambulatory Visit: Payer: Self-pay | Admitting: Physician Assistant

## 2020-02-16 DIAGNOSIS — K219 Gastro-esophageal reflux disease without esophagitis: Secondary | ICD-10-CM

## 2020-03-08 ENCOUNTER — Other Ambulatory Visit: Payer: Self-pay | Admitting: Physician Assistant

## 2020-04-12 ENCOUNTER — Other Ambulatory Visit: Payer: Self-pay | Admitting: Physician Assistant

## 2020-04-12 DIAGNOSIS — K219 Gastro-esophageal reflux disease without esophagitis: Secondary | ICD-10-CM

## 2020-04-20 NOTE — Progress Notes (Signed)
Complete physical exam   Patient: Kayla English   DOB: 1961-06-20   59 y.o. Female  MRN: [id number] Visit Date: 04/21/2020  Today's healthcare provider: Mar Daring, PA-C   Chief Complaint  Patient presents with  . Annual Exam   Subjective    Kayla English is a 59 y.o. female who presents today for a complete physical exam.  She reports consuming a general diet. The patient does not participate in regular exercise at present.She is a Development worker, community. She generally feels well. She reports sleeping poorly. She does not have additional problems to discuss today.  HPI  Pap: 05/27/17- followed by GYN Dr. Kenton Kingfisher Recommendation is for monitoring by way of PAP in 6 months after biopsy. This was not done.   Past Medical History:  Diagnosis Date  . Avitaminosis D   . Back pain   . BRCA negative 10/2017   MyRisk neg; riskscore=6.4%/IBIS=9.6%  . Breast nodule   . Cerumen impaction   . Depression   . Family history of ovarian cancer    10/18 cancer genetic testing letter sent  . Fatigue   . Fibrocystic breast   . Fibromyalgia   . Headache   . Insomnia   . Irregular menses   . Migraine   . Osteoarthritis   . Pelvic pain in female   . Pelvic pain in female   . Polycystic ovaries   . Rash   . Sinusitis   . Snoring   . TMJ arthritis   . Transient adjustment reaction with anxiety   . Trochanteric bursitis   . Urinary frequency   . UTI (lower urinary tract infection)   . Weight gain    Past Surgical History:  Procedure Laterality Date  . CESAREAN SECTION  1977  . neck disc sugery  2012  . SHOULDER SURGERY  2009   Social History   Socioeconomic History  . Marital status: Married    Spouse name: Not on file  . Number of children: Not on file  . Years of education: Not on file  . Highest education level: Not on file  Occupational History  . Not on file  Tobacco Use  . Smoking status: Never Smoker  . Smokeless tobacco: Never Used  Vaping Use  . Vaping Use:  Never used  Substance and Sexual Activity  . Alcohol use: No    Alcohol/week: 0.0 standard drinks  . Drug use: No  . Sexual activity: Yes  Other Topics Concern  . Not on file  Social History Narrative  . Not on file   Social Determinants of Health   Financial Resource Strain:   . Difficulty of Paying Living Expenses: Not on file  Food Insecurity:   . Worried About Charity fundraiser in the Last Year: Not on file  . Ran Out of Food in the Last Year: Not on file  Transportation Needs:   . Lack of Transportation (Medical): Not on file  . Lack of Transportation (Non-Medical): Not on file  Physical Activity:   . Days of Exercise per Week: Not on file  . Minutes of Exercise per Session: Not on file  Stress:   . Feeling of Stress : Not on file  Social Connections:   . Frequency of Communication with Friends and Family: Not on file  . Frequency of Social Gatherings with Friends and Family: Not on file  . Attends Religious Services: Not on file  . Active Member of Clubs or  Organizations: Not on file  . Attends Archivist Meetings: Not on file  . Marital Status: Not on file  Intimate Partner Violence:   . Fear of Current or Ex-Partner: Not on file  . Emotionally Abused: Not on file  . Physically Abused: Not on file  . Sexually Abused: Not on file   Family Status  Relation Name Status  . Brother  (Not Specified)  . PGM  (Not Specified)  . PGF  (Not Specified)  . MGF  (Not Specified)   Family History  Problem Relation Age of Onset  . Hypertension Brother   . Ovarian cancer Paternal Grandmother 77  . Prostate cancer Paternal Grandfather   . Lung cancer Maternal Grandfather    No Known Allergies  Patient Care Team: Rubye Beach as PCP - General (Family Medicine)   Medications: Outpatient Medications Prior to Visit  Medication Sig  . acetaminophen (TYLENOL) 325 MG tablet Take by mouth. Reported on 03/04/2016  . Calcium Carbonate-Vitamin D  (CALCIUM-VITAMIN D) 500-200 MG-UNIT tablet Take by mouth.  . conjugated estrogens (PREMARIN) vaginal cream Place 1 Applicatorful vaginally daily. Apply 0.55m (pea-sized amount)  just inside the vaginal introitus with a finger-tip every night for two weeks and then Monday, Wednesday and Friday nights.  . DULoxetine (CYMBALTA) 30 MG capsule TAKE 1 CAPSULE BY MOUTH  DAILY. TAKE WITH 60MG  CAPSULE DAILY FOR TOTAL OF  90MG  . DULoxetine (CYMBALTA) 60 MG capsule TAKE 1 CAPSULE BY MOUTH  DAILY  . ibuprofen (ADVIL,MOTRIN) 400 MG tablet Take 400 mg by mouth every 6 (six) hours as needed.  . Multiple Vitamin (MULTI-VITAMINS) TABS Take by mouth.  .Marland Kitchenomeprazole (PRILOSEC) 20 MG capsule TAKE 1 CAPSULE BY MOUTH  DAILY  . Probiotic Product (PROBIOTIC & ACIDOPHILUS EX ST PO) Take by mouth.  . SUMAtriptan (IMITREX) 100 MG tablet Take 100 mg by mouth every 2 (two) hours as needed for migraine. May repeat in 2 hours if headache persists or recurs.  . topiramate (TOPAMAX) 100 MG tablet Take by mouth.  . Vitamin D, Cholecalciferol, 1000 UNITS CAPS Take by mouth.  . isometheptene-acetaminophen-dichloralphenazone (MIDRIN) 65-100-325 MG capsule Take by mouth. Reported on 03/04/2016  . meloxicam (MOBIC) 15 MG tablet Take 15 mg by mouth as needed for pain.  . methocarbamol (ROBAXIN) 500 MG tablet   . sertraline (ZOLOFT) 100 MG tablet TAKE 1 TABLET BY MOUTH  DAILY   No facility-administered medications prior to visit.    Review of Systems  Constitutional: Negative.   HENT: Negative.   Eyes: Negative.   Respiratory: Negative.   Cardiovascular: Negative.   Gastrointestinal: Negative.   Endocrine: Negative.   Genitourinary: Negative.   Musculoskeletal: Positive for arthralgias, back pain, myalgias and neck pain.  Skin: Negative.   Allergic/Immunologic: Negative.   Neurological: Positive for light-headedness.  Hematological: Bruises/bleeds easily.  Psychiatric/Behavioral: Positive for sleep disturbance.    Last  CBC Lab Results  Component Value Date   WBC 7.0 04/21/2020   HGB 13.2 04/21/2020   HCT 40.7 04/21/2020   MCV 90 04/21/2020   MCH 29.2 04/21/2020   RDW 13.1 04/21/2020   PLT 279 067/20/9470  Last metabolic panel Lab Results  Component Value Date   GLUCOSE 95 04/21/2020   NA 141 04/21/2020   K 4.0 04/21/2020   CL 108 (H) 04/21/2020   CO2 20 04/21/2020   BUN 13 04/21/2020   CREATININE 0.90 04/21/2020   GFRNONAA 70 04/21/2020   GFRAA 80 04/21/2020  CALCIUM 9.2 04/21/2020   PROT 6.6 04/21/2020   ALBUMIN 4.3 04/21/2020   LABGLOB 2.3 04/21/2020   AGRATIO 1.9 04/21/2020   BILITOT 0.3 04/21/2020   ALKPHOS 81 04/21/2020   AST 23 04/21/2020   ALT 26 04/21/2020      Objective    BP 116/81 (BP Location: Left Arm, Patient Position: Sitting, Cuff Size: Large)   Pulse 88   Temp 98.6 F (37 C) (Oral)   Resp 16   Ht $R'5\' 5"'NP$  (1.651 m)   Wt 199 lb 9.6 oz (90.5 kg)   BMI 33.22 kg/m  BP Readings from Last 3 Encounters:  04/21/20 116/81  03/11/18 120/88  05/22/17 120/80   Wt Readings from Last 3 Encounters:  04/21/20 199 lb 9.6 oz (90.5 kg)  03/11/18 194 lb 6.4 oz (88.2 kg)  05/22/17 199 lb (90.3 kg)      Physical Exam Vitals reviewed.  Constitutional:      General: She is not in acute distress.    Appearance: Normal appearance. She is well-developed. She is obese. She is not ill-appearing or diaphoretic.  HENT:     Head: Normocephalic and atraumatic.     Right Ear: Hearing, tympanic membrane, ear canal and external ear normal.     Left Ear: Hearing, tympanic membrane, ear canal and external ear normal.     Nose: Nose normal.     Mouth/Throat:     Mouth: Mucous membranes are moist.     Pharynx: Oropharynx is clear. Uvula midline. No oropharyngeal exudate.  Eyes:     General: No scleral icterus.       Right eye: No discharge.        Left eye: No discharge.     Extraocular Movements: Extraocular movements intact.     Conjunctiva/sclera: Conjunctivae normal.      Pupils: Pupils are equal, round, and reactive to light.  Neck:     Thyroid: No thyromegaly.     Vascular: No carotid bruit or JVD.     Trachea: No tracheal deviation.  Cardiovascular:     Rate and Rhythm: Normal rate and regular rhythm.     Pulses: Normal pulses.     Heart sounds: Normal heart sounds. No murmur heard.  No friction rub. No gallop.   Pulmonary:     Effort: Pulmonary effort is normal. No respiratory distress.     Breath sounds: Normal breath sounds. No wheezing or rales.  Chest:     Chest wall: No tenderness.     Breasts: Breasts are symmetrical.        Right: No inverted nipple, mass, nipple discharge, skin change or tenderness.        Left: No inverted nipple, mass, nipple discharge, skin change or tenderness.  Abdominal:     General: Bowel sounds are normal. There is no distension.     Palpations: Abdomen is soft. There is no mass.     Tenderness: There is no abdominal tenderness. There is no guarding or rebound.     Hernia: There is no hernia in the left inguinal area.  Genitourinary:    General: Normal vulva.     Exam position: Supine.     Labia:        Right: No rash, tenderness, lesion or injury.        Left: No rash, tenderness, lesion or injury.      Vagina: Normal. No signs of injury. No vaginal discharge, erythema, tenderness or bleeding.     Cervix: No  cervical motion tenderness, discharge or friability.     Adnexa:        Right: No mass, tenderness or fullness.         Left: No mass, tenderness or fullness.       Rectum: Normal.  Musculoskeletal:        General: No tenderness. Normal range of motion.     Cervical back: Normal range of motion and neck supple.     Right lower leg: No edema.     Left lower leg: No edema.  Lymphadenopathy:     Cervical: No cervical adenopathy.  Skin:    General: Skin is warm and dry.     Capillary Refill: Capillary refill takes less than 2 seconds.     Findings: No rash.  Neurological:     General: No focal  deficit present.     Mental Status: She is alert and oriented to person, place, and time. Mental status is at baseline.     Cranial Nerves: No cranial nerve deficit.     Coordination: Coordination normal.     Deep Tendon Reflexes: Reflexes are normal and symmetric.  Psychiatric:        Mood and Affect: Mood normal.        Behavior: Behavior normal.        Thought Content: Thought content normal.        Judgment: Judgment normal.       Last depression screening scores PHQ 2/9 Scores 04/21/2020 03/11/2018 03/05/2017  PHQ - 2 Score 0 0 0  PHQ- 9 Score 6 - -   Last fall risk screening Fall Risk  04/21/2020  Falls in the past year? 1  Number falls in past yr: 1  Comment slipped while dog walking  Injury with Fall? 1  Comment concussion   Last Audit-C alcohol use screening Alcohol Use Disorder Test (AUDIT) 04/21/2020  1. How often do you have a drink containing alcohol? 0  2. How many drinks containing alcohol do you have on a typical day when you are drinking? 0  3. How often do you have six or more drinks on one occasion? 0  AUDIT-C Score 0  Alcohol Brief Interventions/Follow-up AUDIT Score <7 follow-up not indicated   A score of 3 or more in women, and 4 or more in men indicates increased risk for alcohol abuse, EXCEPT if all of the points are from question 1   No results found for any visits on 04/21/20.  Assessment & Plan    Routine Health Maintenance and Physical Exam  Exercise Activities and Dietary recommendations Goals   None     Immunization History  Administered Date(s) Administered  . Influenza,inj,Quad PF,6+ Mos 05/13/2017  . Td 06/01/2003  . Tdap 11/08/2009    Health Maintenance  Topic Date Due  . PAP SMEAR-Modifier  09/05/2017  . MAMMOGRAM  10/23/2019  . TETANUS/TDAP  11/09/2019  . COLONOSCOPY  12/26/2019  . INFLUENZA VACCINE  03/19/2020  . COVID-19 Vaccine  Completed  . Hepatitis C Screening  Completed  . HIV Screening  Completed    Discussed  health benefits of physical activity, and encouraged her to engage in regular exercise appropriate for her age and condition.  1. Annual physical exam Normal physical exam today. Will check labs as below and f/u pending lab results. If labs are stable and WNL she will not need to have these rechecked for one year at her next annual physical exam. She is to call the office  in the meantime if she has any acute issue, questions or concerns. - CBC with Differential/Platelet - Comprehensive metabolic panel - Hemoglobin A1c - Lipid panel - TSH  2. Encounter for breast cancer screening using non-mammogram modality Breast exam today was normal. There is no family history of breast cancer. She does perform regular self breast exams. Mammogram was ordered as below. Information for [facility] clinic was given to patient so she may schedule her mammogram at her convenience. - MM 3D SCREEN BREAST BILATERAL; Future  3. Cervical cancer screening Pap collected today. Will send as below and f/u pending results. - Cytology - PAP  4. Colon cancer screening Due for colon cancer screening. No family history. No polyps.  - Cologuard  5. Class 1 obesity due to excess calories with serious comorbidity and body mass index (BMI) of 33.0 to 33.9 in adult Counseled patient on healthy lifestyle modifications including dieting and exercise.   6. Avitaminosis D Postmenopausal and has history. Will check labs as below and f/u pending results. - Vitamin D (25 hydroxy)  7. Need for influenza vaccination Flu vaccine given today without complication. Patient sat upright for 15 minutes to check for adverse reaction before being released. - Flu Vaccine QUAD 36+ mos IM   No follow-ups on file.     Reynolds Bowl, PA-C, have reviewed all documentation for this visit. The documentation on 05/02/20 for the exam, diagnosis, procedures, and orders are all accurate and complete.   Rubye Beach    [facility] [phone] (phone) [phone] (fax)  [place]

## 2020-04-21 ENCOUNTER — Ambulatory Visit (INDEPENDENT_AMBULATORY_CARE_PROVIDER_SITE_OTHER): Payer: Managed Care, Other (non HMO) | Admitting: Physician Assistant

## 2020-04-21 ENCOUNTER — Other Ambulatory Visit (HOSPITAL_COMMUNITY)
Admission: RE | Admit: 2020-04-21 | Discharge: 2020-04-21 | Disposition: A | Discharge status: Home / Self Care | Payer: Managed Care, Other (non HMO) | Source: Ambulatory Visit | Attending: Physician Assistant | Admitting: Physician Assistant

## 2020-04-21 ENCOUNTER — Encounter: Payer: Self-pay | Admitting: Physician Assistant

## 2020-04-21 ENCOUNTER — Other Ambulatory Visit: Payer: Self-pay

## 2020-04-21 VITALS — BP 116/81 | HR 88 | Temp 98.6°F | Resp 16 | Ht 65.0 in | Wt 199.6 lb

## 2020-04-21 DIAGNOSIS — Z124 Encounter for screening for malignant neoplasm of cervix: Secondary | ICD-10-CM | POA: Insufficient documentation

## 2020-04-21 DIAGNOSIS — Z1211 Encounter for screening for malignant neoplasm of colon: Secondary | ICD-10-CM

## 2020-04-21 DIAGNOSIS — Z1239 Encounter for other screening for malignant neoplasm of breast: Secondary | ICD-10-CM

## 2020-04-21 DIAGNOSIS — Z6833 Body mass index (BMI) 33.0-33.9, adult: Secondary | ICD-10-CM

## 2020-04-21 DIAGNOSIS — E559 Vitamin D deficiency, unspecified: Secondary | ICD-10-CM

## 2020-04-21 DIAGNOSIS — E6609 Other obesity due to excess calories: Secondary | ICD-10-CM

## 2020-04-21 DIAGNOSIS — Z0000 Encounter for general adult medical examination without abnormal findings: Secondary | ICD-10-CM

## 2020-04-21 DIAGNOSIS — Z23 Encounter for immunization: Secondary | ICD-10-CM

## 2020-04-21 NOTE — Patient Instructions (Signed)

## 2020-04-22 LAB — CBC WITH DIFFERENTIAL/PLATELET
Basophils Absolute: 0 10*3/uL (ref 0.0–0.2)
Basos: 1 %
EOS (ABSOLUTE): 0.3 10*3/uL (ref 0.0–0.4)
Eos: 4 %
Hematocrit: 40.7 % (ref 34.0–46.6)
Hemoglobin: 13.2 g/dL (ref 11.1–15.9)
Immature Grans (Abs): 0 10*3/uL (ref 0.0–0.1)
Immature Granulocytes: 0 %
Lymphocytes Absolute: 2.1 10*3/uL (ref 0.7–3.1)
Lymphs: 30 %
MCH: 29.2 pg (ref 26.6–33.0)
MCHC: 32.4 g/dL (ref 31.5–35.7)
MCV: 90 fL (ref 79–97)
Monocytes Absolute: 0.6 10*3/uL (ref 0.1–0.9)
Monocytes: 9 %
Neutrophils Absolute: 3.9 10*3/uL (ref 1.4–7.0)
Neutrophils: 56 %
Platelets: 279 10*3/uL (ref 150–450)
RBC: 4.52 x10E6/uL (ref 3.77–5.28)
RDW: 13.1 % (ref 11.7–15.4)
WBC: 7 10*3/uL (ref 3.4–10.8)

## 2020-04-22 LAB — TSH: TSH: 3.8 u[IU]/mL (ref 0.450–4.500)

## 2020-04-22 LAB — COMPREHENSIVE METABOLIC PANEL
ALT: 26 IU/L (ref 0–32)
AST: 23 IU/L (ref 0–40)
Albumin/Globulin Ratio: 1.9 (ref 1.2–2.2)
Albumin: 4.3 g/dL (ref 3.8–4.9)
Alkaline Phosphatase: 81 IU/L (ref 48–121)
BUN/Creatinine Ratio: 14 (ref 12–28)
BUN: 13 mg/dL (ref 8–27)
Bilirubin Total: 0.3 mg/dL (ref 0.0–1.2)
CO2: 20 mmol/L (ref 20–29)
Calcium: 9.2 mg/dL (ref 8.7–10.3)
Chloride: 108 mmol/L — ABNORMAL HIGH (ref 96–106)
Creatinine, Ser: 0.9 mg/dL (ref 0.57–1.00)
GFR calc Af Amer: 80 mL/min/{1.73_m2} (ref >59)
GFR calc non Af Amer: 70 mL/min/{1.73_m2} (ref >59)
Globulin, Total: 2.3 g/dL (ref 1.5–4.5)
Glucose: 95 mg/dL (ref 65–99)
Potassium: 4 mmol/L (ref 3.5–5.2)
Sodium: 141 mmol/L (ref 134–144)
Total Protein: 6.6 g/dL (ref 6.0–8.5)

## 2020-04-22 LAB — LIPID PANEL
Chol/HDL Ratio: 3.7 ratio (ref 0.0–4.4)
Cholesterol, Total: 213 mg/dL — ABNORMAL HIGH (ref 100–199)
HDL: 58 mg/dL (ref >39)
LDL Chol Calc (NIH): 121 mg/dL — ABNORMAL HIGH (ref 0–99)
Triglycerides: 192 mg/dL — ABNORMAL HIGH (ref 0–149)
VLDL Cholesterol Cal: 34 mg/dL (ref 5–40)

## 2020-04-22 LAB — HEMOGLOBIN A1C
Est. average glucose Bld gHb Est-mCnc: 120 mg/dL
Hgb A1c MFr Bld: 5.8 % — ABNORMAL HIGH (ref 4.8–5.6)

## 2020-04-22 LAB — VITAMIN D 25 HYDROXY (VIT D DEFICIENCY, FRACTURES): Vit D, 25-Hydroxy: 27.1 ng/mL — ABNORMAL LOW (ref 30.0–100.0)

## 2020-04-28 ENCOUNTER — Other Ambulatory Visit: Payer: Self-pay | Admitting: Physician Assistant

## 2020-04-28 DIAGNOSIS — K219 Gastro-esophageal reflux disease without esophagitis: Secondary | ICD-10-CM

## 2020-04-28 LAB — CYTOLOGY - PAP
Comment: NEGATIVE
Diagnosis: NEGATIVE
High risk HPV: NEGATIVE

## 2020-05-10 ENCOUNTER — Other Ambulatory Visit: Payer: Self-pay | Admitting: Physician Assistant

## 2020-05-10 DIAGNOSIS — M797 Fibromyalgia: Secondary | ICD-10-CM

## 2020-05-10 NOTE — Telephone Encounter (Signed)
Requested  medications are  due for refill today yes  Requested medications are on the active medication list yes  Last refill 7/29  Last visit 9/21  Notes to clinic Just had annual physical, do not see this med/dx discussed. Please assess.

## 2020-05-17 LAB — COLOGUARD

## 2020-05-19 ENCOUNTER — Telehealth: Payer: Self-pay

## 2020-05-19 NOTE — Telephone Encounter (Signed)
LMTCB- regarding her Cologuard specimen. Pt will need to recollect Cologuard will contact. If patient calls back please let patient know. Thanks.

## 2020-05-22 NOTE — Telephone Encounter (Signed)
Pt returned call and was advised about Cologuard recollect/ please advise

## 2020-06-02 LAB — COLOGUARD

## 2020-06-22 LAB — COLOGUARD
COLOGUARD: NEGATIVE
Cologuard: NEGATIVE

## 2020-08-29 ENCOUNTER — Ambulatory Visit: Payer: Managed Care, Other (non HMO) | Admitting: Family Medicine

## 2020-08-29 ENCOUNTER — Other Ambulatory Visit: Payer: Self-pay

## 2020-08-29 ENCOUNTER — Encounter: Payer: Self-pay | Admitting: Family Medicine

## 2020-08-29 VITALS — BP 116/80 | HR 93 | Temp 98.3°F | Resp 16 | Wt 200.0 lb

## 2020-08-29 DIAGNOSIS — T148XXA Other injury of unspecified body region, initial encounter: Secondary | ICD-10-CM | POA: Diagnosis not present

## 2020-08-29 NOTE — Patient Instructions (Signed)
Hematoma A hematoma is a collection of blood. A hematoma can happen:  Under the skin.  In an organ.  In a body space.  In a joint space.  In other tissues. The blood can thicken (clot) to form a lump that you can see and feel. The lump is often hard and may become sore and tender. The lump can be very small or very big. Most hematomas get better in a few days to weeks. However, some hematomas may be serious and need medical care. What are the causes? This condition is caused by:  An injury.  Blood that leaks under the skin.  Problems from surgeries.  Medical conditions that cause bleeding or bruising. What increases the risk? You are more likely to develop this condition if:  You are an older adult.  You use medicines that thin your blood. What are the signs or symptoms? Symptoms depend on where the hematoma is in your body.  If the hematoma is under the skin, there is: ? A firm lump on the body. ? Pain and tenderness in the area. ? Bruising. The skin above the lump may be blue, dark blue, purple-red, or yellowish.  If the hematoma is deep in the tissues or body spaces, there may be: ? Blood in the stomach. This may cause pain in the belly (abdomen), weakness, passing out (fainting), and shortness of breath. ? Blood in the head. This may cause a headache, weakness, trouble speaking or understanding speech, or passing out.   How is this diagnosed? This condition is diagnosed based on:  Your medical history.  A physical exam.  Imaging tests, such as ultrasound or CT scan.  Blood tests. How is this treated? Treatment depends on the cause, size, and location of the hematoma. Treatment may include:  Doing nothing. Many hematomas go away on their own without treatment.  Surgery or close monitoring. This may be needed for large hematomas or hematomas that affect the body's organs.  Medicines. These may be given if a medical condition caused the hematoma. Follow  these instructions at home: Managing pain, stiffness, and swelling  If told, put ice on the area. ? Put ice in a plastic bag. ? Place a towel between your skin and the bag. ? Leave the ice on for 20 minutes, 2-3 times a day for the first two days.  If told, put heat on the affected area after putting ice on the area for two days. Use the heat source that your doctor tells you to use. This could be a moist heat pack or a heating pad. To do this: ? Place a towel between your skin and the heat source. ? Leave the heat on for 20-30 minutes. ? Remove the heat if your skin turns bright red. This is very important if you are unable to feel pain, heat, or cold. You may have a greater risk of getting burned.  Raise (elevate) the affected area above the level of your heart while you are sitting or lying down.  Wrap the affected area with an elastic bandage, if told by your doctor. Do not wrap the bandage too tightly.  If your hematoma is on a leg or foot and is painful, your doctor may give you crutches. Use them as told by your doctor.   General instructions  Take over-the-counter and prescription medicines only as told by your doctor.  Keep all follow-up visits as told by your doctor. This is important. Contact a doctor if:  You   have a fever.  The swelling or bruising gets worse.  You start to get more hematomas. Get help right away if:  Your pain gets worse.  Your pain is not getting better with medicine.  Your skin over the hematoma breaks or starts to bleed.  Your hematoma is in your chest or belly and you: ? Pass out. ? Feel weak. ? Become short of breath.  You have a hematoma on your scalp that is caused by a fall or injury, and you: ? Have a headache that gets worse. ? Have trouble speaking or understanding speech. ? Become less alert or you pass out. Summary  A hematoma is a collection of blood in any part of your body.  Most hematomas get better on their own in a  few days to weeks. Some may need medical care.  Follow instructions from your doctor about how to care for your hematoma.  Contact a doctor if the swelling or bruising gets worse, or if you are short of breath. This information is not intended to replace advice given to you by your health care provider. Make sure you discuss any questions you have with your health care provider. Document Revised: 01/08/2018 Document Reviewed: 01/08/2018 Elsevier Patient Education  2021 [employer]  

## 2020-08-29 NOTE — Progress Notes (Signed)
Established patient visit   Patient: Kayla English   DOB: 1961-04-08   60 y.o. Female  MRN: [id number] Visit Date: 08/29/2020  Today's healthcare provider: Shirlee Latch, MD   Chief Complaint  Patient presents with  . Fall   Subjective    HPI   Patient reports she fell from stairs in November and hurt her right side. Patient reports she had a big bruise on her right thigh which has now cleared. Patient reports she still has a "knot" on the same area where bruise was at. Patient reports knot is painful, especially when she is sitting. No calf swelling. Bruising discoloration took >1 month to resolve. Knot is getting smaller.   Patient Active Problem List   Diagnosis Date Noted  . BMI 33.0-33.9,adult 03/11/2018  . Adjustment disorder with anxiety 01/29/2016  . Anxiety 01/29/2016  . Breast lump 01/29/2016  . Cerumen impaction 01/29/2016  . History of chicken pox 01/29/2016  . Dizziness 01/29/2016  . Fatigue 01/29/2016  . Fibrositis 01/29/2016  . Cephalalgia 01/29/2016  . Cannot sleep 01/29/2016  . Headache, migraine 01/29/2016  . Arthritis, degenerative 01/29/2016  . Adnexal pain 01/29/2016  . Bilateral polycystic ovarian syndrome 01/29/2016  . Post menopausal syndrome 01/29/2016  . Cutaneous eruption 01/29/2016  . Snores 01/29/2016  . Arthritis of temporomandibular joint 01/29/2016  . Bursitis, trochanteric 01/29/2016  . Severe obstructive sleep apnea 01/29/2016  . Avitaminosis D 01/29/2016  . Abnormal weight gain 01/29/2016  . Depression 10/04/2015  . Back pain 02/22/2015   Social History   Tobacco Use  . Smoking status: Never Smoker  . Smokeless tobacco: Never Used  Vaping Use  . Vaping Use: Never used  Substance Use Topics  . Alcohol use: No    Alcohol/week: 0.0 standard drinks  . Drug use: No   No Known Allergies     Medications: Outpatient Medications Prior to Visit  Medication Sig  . acetaminophen (TYLENOL) 325 MG tablet Take by mouth.  Reported on 03/04/2016  . Calcium Carbonate-Vitamin D (CALCIUM-VITAMIN D) 500-200 MG-UNIT tablet Take by mouth.  . conjugated estrogens (PREMARIN) vaginal cream Place 1 Applicatorful vaginally daily. Apply 0.5mg  (pea-sized amount)  just inside the vaginal introitus with a finger-tip every night for two weeks and then Monday, Wednesday and Friday nights.  . DULoxetine (CYMBALTA) 30 MG capsule TAKE 1 CAPSULE BY MOUTH  DAILY. TAKE WITH 60MG   CAPSULE DAILY FOR TOTAL OF  90MG   . DULoxetine (CYMBALTA) 60 MG capsule TAKE 1 CAPSULE BY MOUTH  DAILY  . ibuprofen (ADVIL,MOTRIN) 400 MG tablet Take 400 mg by mouth every 6 (six) hours as needed.  . Multiple Vitamin (MULTI-VITAMINS) TABS Take by mouth.  omeprazole (PRILOSEC) 20 MG capsule TAKE 1 CAPSULE BY MOUTH  DAILY  . Probiotic Product (PROBIOTIC & ACIDOPHILUS EX ST PO) Take by mouth.  . SUMAtriptan (IMITREX) 100 MG tablet Take 100 mg by mouth every 2 (two) hours as needed for migraine. May repeat in 2 hours if headache persists or recurs.  . topiramate (TOPAMAX) 100 MG tablet Take 200 mg by mouth 2 (two) times daily.  . Vitamin D, Cholecalciferol, 1000 UNITS CAPS Take by mouth.   No facility-administered medications prior to visit.    Review of Systems  Constitutional: Negative.   Respiratory: Negative.   Cardiovascular: Negative.   Musculoskeletal: Positive for myalgias.    Last CBC Lab Results  Component Value Date   WBC 7.0 04/21/2020   HGB 13.2 04/21/2020   HCT  40.7 04/21/2020   MCV 90 04/21/2020   MCH 29.2 04/21/2020   RDW 13.1 04/21/2020   PLT 279 04/21/2020   Last metabolic panel Lab Results  Component Value Date   GLUCOSE 95 04/21/2020   NA 141 04/21/2020   K 4.0 04/21/2020   CL 108 (H) 04/21/2020   CO2 20 04/21/2020   BUN 13 04/21/2020   CREATININE 0.90 04/21/2020   GFRNONAA 70 04/21/2020   GFRAA 80 04/21/2020   CALCIUM 9.2 04/21/2020   PROT 6.6 04/21/2020   ALBUMIN 4.3 04/21/2020   LABGLOB 2.3 04/21/2020   AGRATIO  1.9 04/21/2020   BILITOT 0.3 04/21/2020   ALKPHOS 81 04/21/2020   AST 23 04/21/2020   ALT 26 04/21/2020      Objective    BP 116/80 (BP Location: Left Arm, Patient Position: Sitting, Cuff Size: Large)   Pulse 93   Temp 98.3 F (36.8 C) (Oral)   Resp 16   Wt 200 lb (90.7 kg)   SpO2 97%   BMI 33.28 kg/m  BP Readings from Last 3 Encounters:  08/29/20 116/80  04/21/20 116/81  03/11/18 120/88   Wt Readings from Last 3 Encounters:  08/29/20 200 lb (90.7 kg)  04/21/20 199 lb 9.6 oz (90.5 kg)  03/11/18 194 lb 6.4 oz (88.2 kg)      Physical Exam Constitutional:      General: She is not in acute distress.    Appearance: Normal appearance.  HENT:     Head: Normocephalic and atraumatic.  Cardiovascular:     Rate and Rhythm: Normal rate and regular rhythm.     Pulses: Normal pulses.  Pulmonary:     Effort: Pulmonary effort is normal. No respiratory distress.  Musculoskeletal:     Comments: 2cm knot palpable on R lateral thigh with mild TTP.  No surrounding erythema or bruising. No calf swelling or TTP. Negative Homans  Neurological:     Mental Status: She is alert and oriented to person, place, and time. Mental status is at baseline.     Sensory: No sensory deficit.     Motor: No weakness.     Gait: Gait normal.       No results found for any visits on 08/29/20.  Assessment & Plan     1. Hematoma - new problem - discussed with patient that this is residual hematoma that is still trying to resorb  - no signs/symptoms of DVT - as it is getting smaller, will not [place] at this time - discussed return precautions - encourage NSAIDs prn and heat frequently   Return if symptoms worsen or fail to improve.      I, Shirlee Latch, MD, have reviewed all documentation for this visit. The documentation on 08/29/20 for the exam, diagnosis, procedures, and orders are all accurate and complete.   Heriberto Stmartin, Marzella Schlein, MD, MPH [facility]

## 2021-02-28 ENCOUNTER — Telehealth: Payer: Self-pay

## 2021-02-28 NOTE — Telephone Encounter (Signed)
Copied from CRM [id number]. Topic: Referral - Request for Referral >> Feb 27, 2021  3:51 PM Gwenlyn Fudge wrote: Has patient seen PCP for this complaint? No. *If NO, is insurance requiring patient see PCP for this issue before PCP can refer them? Referral for which specialty: Mammography  Preferred provider/office: [facility]  Reason for referral: Pt states that she is needing to have her annual mammogram. She states that she may have a lump and that there is some tenderness. Please advise.

## 2021-02-28 NOTE — Telephone Encounter (Signed)
Dr. B she hasn't been seen for this concern.  I know you are not in the office today.Prefers female. No appointments available with you. Do we just order her mammogram?

## 2021-03-01 NOTE — Telephone Encounter (Signed)
Appt at [facility] 03/02/21 8:20am.

## 2021-03-02 ENCOUNTER — Other Ambulatory Visit: Payer: Self-pay

## 2021-03-02 ENCOUNTER — Encounter: Payer: Self-pay | Admitting: Internal Medicine

## 2021-03-02 ENCOUNTER — Ambulatory Visit: Payer: Managed Care, Other (non HMO) | Admitting: Internal Medicine

## 2021-03-02 VITALS — BP 119/83 | HR 101 | Temp 98.6°F | Ht 65.0 in | Wt 194.2 lb

## 2021-03-02 DIAGNOSIS — N63 Unspecified lump in unspecified breast: Secondary | ICD-10-CM | POA: Diagnosis not present

## 2021-03-02 NOTE — Progress Notes (Signed)
   BP 119/83   Pulse (!) 101   Temp 98.6 F (37 C) (Oral)   Ht 5\' 5"  (1.651 m)   Wt 194 lb 3.2 oz (88.1 kg)   SpO2 99%   BMI 32.32 kg/m    Subjective:    Patient ID: , female    DOB: Jul 05, 1961, 60 y.o.   MRN: [id number]  Chief Complaint  Patient presents with   Right breast pain.    For the past 2 months. 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    HPI: Kayla English is a 60 y.o. female  Breast lump noticed x 2 months ago. No new Weight loss, appetite is wnl ACE inhibitor therapy was not prescribed due - no  FH of breast cancer in mom or sister. C/o back pain radiating to the right side of the upper back and across to the right breast.    Chief Complaint  Patient presents with   Right breast pain.    For the past 2 months. 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    Relevant past medical, surgical, family and social history reviewed and updated as indicated. Interim medical history since our last visit reviewed. Allergies and medications reviewed and updated.  Review of Systems  Per HPI unless specifically indicated above     Objective:    BP 119/83   Pulse (!) 101   Temp 98.6 F (37 C) (Oral)   Ht 5\' 5"  (1.651 m)   Wt 194 lb 3.2 oz (88.1 kg)   SpO2 99%   BMI 32.32 kg/m   Wt Readings from Last 3 Encounters:  03/02/21 194 lb 3.2 oz (88.1 kg)  08/29/20 200 lb (90.7 kg)  04/21/20 199 lb 9.6 oz (90.5 kg)    Physical Exam Vitals and nursing note reviewed.  Constitutional:      General: She is not in acute distress.    Appearance: Normal appearance. She is not ill-appearing or diaphoretic.  Eyes:     Conjunctiva/sclera: Conjunctivae normal.  Pulmonary:     Breath sounds: No rhonchi.  Skin:    General: Skin is warm and dry.     Coloration: Skin is not jaundiced or pale.     Findings: No bruising, erythema or lesion.     Comments: Breast  exam does not reveal any lumps  Neurological:     Mental Status: She is alert.   Results for orders placed or performed in visit on 05/30/20  Cologuard  Result Value Ref Range   Cologuard             Assessment & Plan:   Breast lump : unpalpable today  Last mammo 2019 , will reorder.  No famlial ho breast cancer, will need to fu with pcp once has scans.  Recheck Mammogram per radiology protocol If needs 07/30/20 breast will order     Problem List Items Addressed This Visit       Other   Breast lump - Primary   Relevant Orders   MM 3D SCREEN BREAST BILATERAL     Orders Placed This Encounter  Procedures   MM 3D SCREEN BREAST BILATERAL     No orders of the defined types were placed in this encounter.    Follow up plan: No follow-ups on file.

## 2021-03-04 ENCOUNTER — Other Ambulatory Visit: Payer: Self-pay | Admitting: Physician Assistant

## 2021-03-04 DIAGNOSIS — K219 Gastro-esophageal reflux disease without esophagitis: Secondary | ICD-10-CM

## 2021-03-12 ENCOUNTER — Other Ambulatory Visit: Payer: Self-pay | Admitting: Internal Medicine

## 2021-03-12 ENCOUNTER — Telehealth: Payer: Self-pay

## 2021-03-12 DIAGNOSIS — N644 Mastodynia: Secondary | ICD-10-CM

## 2021-03-12 NOTE — Telephone Encounter (Signed)
Patient notified that she can call and schedule her mammogram at [facility].

## 2021-03-12 NOTE — Telephone Encounter (Signed)
Copied from CRM [id number]. Topic: Referral - Status >> Mar 12, 2021 10:39 AM Marylen Ponto wrote: Reason for CRM: Pt stated she still has not heard from [facility] regarding appt for mammogram.Pt seen by Dr Charlotta Newton for this

## 2021-03-15 ENCOUNTER — Other Ambulatory Visit: Payer: Self-pay

## 2021-03-15 ENCOUNTER — Ambulatory Visit
Admission: RE | Admit: 2021-03-15 | Discharge: 2021-03-15 | Disposition: A | Discharge status: Home / Self Care | Payer: Managed Care, Other (non HMO) | Source: Ambulatory Visit | Attending: Internal Medicine | Admitting: Internal Medicine

## 2021-03-15 DIAGNOSIS — N644 Mastodynia: Secondary | ICD-10-CM | POA: Insufficient documentation

## 2021-03-16 ENCOUNTER — Encounter: Payer: Self-pay | Admitting: Internal Medicine

## 2021-03-16 NOTE — Progress Notes (Signed)
Please let pt know this was normal.

## 2021-03-21 ENCOUNTER — Other Ambulatory Visit: Payer: Self-pay | Admitting: Physician Assistant

## 2021-03-21 DIAGNOSIS — K219 Gastro-esophageal reflux disease without esophagitis: Secondary | ICD-10-CM

## 2021-04-12 ENCOUNTER — Other Ambulatory Visit: Payer: Self-pay | Admitting: Physician Assistant

## 2021-04-12 DIAGNOSIS — M797 Fibromyalgia: Secondary | ICD-10-CM

## 2021-04-17 MED ORDER — DULOXETINE HCL 60 MG PO CPEP: 60.0000 mg | ORAL_CAPSULE | Freq: Every day | ORAL | 0 refills | Status: DC

## 2021-04-17 MED ORDER — DULOXETINE HCL 30 MG PO CPEP: ORAL_CAPSULE | ORAL | 0 refills | Status: DC

## 2021-04-17 NOTE — Addendum Note (Signed)
Addended by: Benjiman Core on: 04/17/2021 03:28 PM   Modules accepted: Orders

## 2021-04-17 NOTE — Telephone Encounter (Signed)
Last office visit: 04/21/2020 with Joycelyn Man, PA-C Next OV: 04/24/2021  Last refill: 05/11/2020 #90, with 3 refills

## 2021-04-24 ENCOUNTER — Encounter: Payer: Self-pay | Admitting: Family Medicine

## 2021-04-24 ENCOUNTER — Other Ambulatory Visit: Payer: Self-pay

## 2021-04-24 ENCOUNTER — Ambulatory Visit (INDEPENDENT_AMBULATORY_CARE_PROVIDER_SITE_OTHER): Payer: Managed Care, Other (non HMO) | Admitting: Family Medicine

## 2021-04-24 VITALS — BP 121/90 | HR 100 | Temp 99.1°F | Wt 200.0 lb

## 2021-04-24 DIAGNOSIS — Z0000 Encounter for general adult medical examination without abnormal findings: Secondary | ICD-10-CM | POA: Diagnosis not present

## 2021-04-24 DIAGNOSIS — M159 Polyosteoarthritis, unspecified: Secondary | ICD-10-CM

## 2021-04-24 DIAGNOSIS — Z8669 Personal history of other diseases of the nervous system and sense organs: Secondary | ICD-10-CM | POA: Diagnosis not present

## 2021-04-24 DIAGNOSIS — E559 Vitamin D deficiency, unspecified: Secondary | ICD-10-CM

## 2021-04-24 DIAGNOSIS — E782 Mixed hyperlipidemia: Secondary | ICD-10-CM

## 2021-04-24 NOTE — Progress Notes (Signed)
Complete physical exam   Patient: Kayla English   DOB: 1960-09-15   60 y.o. Female  MRN: [id number] Visit Date: 04/24/2021  Today's healthcare provider: Vernie Murders, PA-C   No chief complaint on file.  Subjective    Kayla English is a 60 y.o. female who presents today for a complete physical exam.  She reports consuming a general diet. The patient has a physically strenuous job, but has no regular exercise apart from work.  She generally feels well. She reports sleeping well. She does not have additional problems to discuss today.    Past Medical History:  Diagnosis Date   Avitaminosis D    Back pain    BRCA negative 10/2017   MyRisk neg; riskscore=6.4%/IBIS=9.6%   Breast nodule    Cerumen impaction    Depression    Family history of ovarian cancer    10/18 cancer genetic testing letter sent   Fatigue    Fibrocystic breast    Fibromyalgia    Headache    Insomnia    Irregular menses    Migraine    Osteoarthritis    Pelvic pain in female    Pelvic pain in female    Polycystic ovaries    Rash    Sinusitis    Snoring    TMJ arthritis    Transient adjustment reaction with anxiety    Trochanteric bursitis    Urinary frequency    UTI (lower urinary tract infection)    Weight gain    Past Surgical History:  Procedure Laterality Date   CESAREAN SECTION  1977   neck disc sugery  2012   SHOULDER SURGERY  2009   Social History   Socioeconomic History   Marital status: Married    Spouse name: Not on file   Number of children: Not on file   Years of education: Not on file   Highest education level: Not on file  Occupational History   Not on file  Tobacco Use   Smoking status: Never   Smokeless tobacco: Never  Vaping Use   Vaping Use: Never used  Substance and Sexual Activity   Alcohol use: No    Alcohol/week: 0.0 standard drinks   Drug use: No   Sexual activity: Yes  Other Topics Concern   Not on file  Social History Narrative   Not on file    Social Determinants of Health   Financial Resource Strain: Not on file  Food Insecurity: Not on file  Transportation Needs: Not on file  Physical Activity: Not on file  Stress: Not on file  Social Connections: Not on file  Intimate Partner Violence: Not on file   Family Status  Relation Name Status   Brother  (Not Specified)   PGM  (Not Specified)   PGF  (Not Specified)   MGF  (Not Specified)   Family History  Problem Relation Age of Onset   Hypertension Brother    Ovarian cancer Paternal Grandmother 76   Prostate cancer Paternal Grandfather    Lung cancer Maternal Grandfather    No Known Allergies  Patient Care Team: Gwyneth Sprout, FNP as PCP - General (Family Medicine)   Medications: Outpatient Medications Prior to Visit  Medication Sig   acetaminophen (TYLENOL) 325 MG tablet Take by mouth. Reported on 03/04/2016   Calcium Carbonate-Vitamin D (CALCIUM-VITAMIN D) 500-200 MG-UNIT tablet Take by mouth.   DULoxetine (CYMBALTA) 30 MG capsule TAKE 1 CAPSULE BY MOUTH  DAILY. TAKE  WITH 60MG  CAPSULE DAILY FOR TOTAL OF  90MG   DULoxetine (CYMBALTA) 60 MG capsule Take 1 capsule (60 mg total) by mouth daily.   ibuprofen (ADVIL,MOTRIN) 400 MG tablet Take 400 mg by mouth every 6 (six) hours as needed.   Multiple Vitamin (MULTI-VITAMINS) TABS Take by mouth.   omeprazole (PRILOSEC) 20 MG capsule TAKE 1 CAPSULE BY MOUTH  DAILY   Probiotic Product (PROBIOTIC & ACIDOPHILUS EX ST PO) Take by mouth.   SUMAtriptan (IMITREX) 100 MG tablet Take 100 mg by mouth every 2 (two) hours as needed for migraine. May repeat in 2 hours if headache persists or recurs.   topiramate (TOPAMAX) 100 MG tablet Take 200 mg by mouth 2 (two) times daily.   Vitamin D, Cholecalciferol, 1000 UNITS CAPS Take by mouth.   No facility-administered medications prior to visit.    Review of Systems  Constitutional: Negative.   HENT: Negative.    Eyes: Negative.   Respiratory: Negative.    Cardiovascular:  Negative.   Gastrointestinal: Negative.   Endocrine: Negative.   Genitourinary: Negative.   Musculoskeletal: Negative.   Skin: Negative.   Allergic/Immunologic: Negative.   Neurological: Negative.   Hematological: Negative.   Psychiatric/Behavioral: Negative.       Objective    BP 121/90 (BP Location: Right Arm, Patient Position: Sitting, Cuff Size: Normal)   Pulse 100   Temp 99.1 F (37.3 C) (Oral)   Wt 200 lb (90.7 kg)   SpO2 100%   BMI 33.28 kg/m  BP Readings from Last 3 Encounters:  04/24/21 121/90  03/02/21 119/83  08/29/20 116/80   Wt Readings from Last 3 Encounters:  04/24/21 200 lb (90.7 kg)  03/02/21 194 lb 3.2 oz (88.1 kg)  08/29/20 200 lb (90.7 kg)    Physical Exam Constitutional:      Appearance: Normal appearance. She is normal weight.  HENT:     Head: Normocephalic and atraumatic.     Right Ear: Tympanic membrane, ear canal and external ear normal.     Left Ear: Tympanic membrane, ear canal and external ear normal.     Nose: Nose normal.     Mouth/Throat:     Mouth: Mucous membranes are moist.     Pharynx: Oropharynx is clear.  Eyes:     Extraocular Movements: Extraocular movements intact.     Conjunctiva/sclera: Conjunctivae normal.     Pupils: Pupils are equal, round, and reactive to light.  Cardiovascular:     Rate and Rhythm: Normal rate and regular rhythm.     Pulses: Normal pulses.     Heart sounds: Normal heart sounds.  Pulmonary:     Effort: Pulmonary effort is normal.     Breath sounds: Normal breath sounds.  Abdominal:     General: Abdomen is flat. Bowel sounds are normal.     Palpations: Abdomen is soft.  Musculoskeletal:        General: Normal range of motion.     Cervical back: Normal range of motion and neck supple.  Skin:    General: Skin is warm and dry.  Neurological:     General: No focal deficit present.     Mental Status: She is alert and oriented to person, place, and time. Mental status is at baseline.  Psychiatric:         Mood and Affect: Mood normal.        Behavior: Behavior normal.        Thought Content: Thought content normal.  Judgment: Judgment normal.     Last depression screening scores PHQ 2/9 Scores 04/24/2021 03/02/2021 08/29/2020  PHQ - 2 Score 0 0 0  PHQ- 9 Score 2 - 6   Last fall risk screening Fall Risk  04/24/2021  Falls in the past year? 0  Number falls in past yr: 0  Comment -  Injury with Fall? 0  Comment -  Risk for fall due to : -  Follow up -   Last Audit-C alcohol use screening Alcohol Use Disorder Test (AUDIT) 04/24/2021  1. How often do you have a drink containing alcohol? 0  2. How many drinks containing alcohol do you have on a typical day when you are drinking? 0  3. How often do you have six or more drinks on one occasion? 0  AUDIT-C Score 0  Alcohol Brief Interventions/Follow-up -   A score of 3 or more in women, and 4 or more in men indicates increased risk for alcohol abuse, EXCEPT if all of the points are from question 1   No results found for any visits on 04/24/21.  Assessment & Plan    Routine Health Maintenance and Physical Exam  Exercise Activities and Dietary recommendations  Goals   Continue walking dogs daily.     Immunization History  Administered Date(s) Administered   Influenza Split 07/10/2011, 06/24/2012   Influenza,inj,Quad PF,6+ Mos 07/30/2013, 05/12/2014, 06/10/2016, 05/13/2017, 06/15/2018, 04/21/2020   Moderna Sars-Covid-2 Vaccination 10/01/2019, 10/29/2019, 07/04/2020   PFIZER(Purple Top)SARS-COV-2 Vaccination 01/20/2021   Td 06/01/2003   Tdap 11/08/2009    Health Maintenance  Topic Date Due   INFLUENZA VACCINE  03/19/2021   Zoster Vaccines- Shingrix (1 of 2) 06/02/2021 (Originally 05/07/2009)   TETANUS/TDAP  03/02/2022 (Originally 11/09/2019)   MAMMOGRAM  03/16/2023   PAP SMEAR-Modifier  04/22/2023   Fecal DNA (Cologuard)  06/23/2023   COVID-19 Vaccine  Completed   Hepatitis C Screening  Completed   HIV Screening   Completed   Pneumococcal Vaccine 55-13 Years old  Aged Out   HPV VACCINES  Aged Out    Discussed health benefits of physical activity, and encouraged her to engage in regular exercise appropriate for her age and condition.  1. Annual physical exam General health good. Normal PAP with negative for HPV in 2021. Normal mammograms on 03-15-21. Plans on getting immunizations at her pharmacy (influenza, second shingles, tetanus). Check routine labs. - CBC with Differential/Platelet - Comprehensive metabolic panel - Lipid Panel With LDL/HDL Ratio - TSH  2. Avitaminosis D Vitamin D level 27.1 a year ago. Still taking 1000 IU qd. Recheck labs. - CBC with Differential/Platelet - VITAMIN D 25 Hydroxy (Vit-D Deficiency, Fractures)  3. History of migraine headaches Headaches are not occurring frequently. If she take Aleve at onset, she can abort most without having to use Imitrex. If migraine gets established, Imitrex still works well. Check labs for routine follow up. Continue Topamax 200 mg BID as prescribed by neurologist (Dr. Manuella Ghazi) for prophylaxis. - CBC with Differential/Platelet - Comprehensive metabolic panel  4. Osteoarthritis of multiple joints, unspecified osteoarthritis type Arthralgia in both knees with some crepitus on exam. Continues to be active and uses Ibuprofen 400 mg QID prn. No swelling or redness to joints. Will offer orthopedic referral if joint pain worsens.  - CBC with Differential/Platelet  5. Mixed hyperlipidemia Lipids elevated last year with low cardiovascular event risk. Has been trying to follow a low fat diet and exercise by walking her dogs daily. Recheck labs to assess progress. -  Comprehensive metabolic panel - Lipid Panel With LDL/HDL Ratio - TSH   No follow-ups on file.     I, Ina Poupard, PA-C, have reviewed all documentation for this visit. The documentation on 04/24/21 for the exam, diagnosis, procedures, and orders are all accurate and  complete.    Vernie Murders, PA-C  [employer] [phone] (phone) [phone] (fax)  [place]

## 2021-04-25 LAB — COMPREHENSIVE METABOLIC PANEL
ALT: 22 IU/L (ref 0–32)
AST: 22 IU/L (ref 0–40)
Albumin/Globulin Ratio: 2.1 (ref 1.2–2.2)
Albumin: 4.6 g/dL (ref 3.8–4.8)
Alkaline Phosphatase: 79 IU/L (ref 44–121)
BUN/Creatinine Ratio: 20 (ref 12–28)
BUN: 18 mg/dL (ref 8–27)
Bilirubin Total: 0.3 mg/dL (ref 0.0–1.2)
CO2: 22 mmol/L (ref 20–29)
Calcium: 9.2 mg/dL (ref 8.7–10.3)
Chloride: 110 mmol/L — ABNORMAL HIGH (ref 96–106)
Creatinine, Ser: 0.88 mg/dL (ref 0.57–1.00)
Globulin, Total: 2.2 g/dL (ref 1.5–4.5)
Glucose: 88 mg/dL (ref 65–99)
Potassium: 4.4 mmol/L (ref 3.5–5.2)
Sodium: 145 mmol/L — ABNORMAL HIGH (ref 134–144)
Total Protein: 6.8 g/dL (ref 6.0–8.5)
eGFR: 75 mL/min/{1.73_m2} (ref >59)

## 2021-04-25 LAB — CBC WITH DIFFERENTIAL/PLATELET
Basophils Absolute: 0.1 10*3/uL (ref 0.0–0.2)
Basos: 1 %
EOS (ABSOLUTE): 0.1 10*3/uL (ref 0.0–0.4)
Eos: 2 %
Hematocrit: 43.2 % (ref 34.0–46.6)
Hemoglobin: 13.7 g/dL (ref 11.1–15.9)
Immature Grans (Abs): 0 10*3/uL (ref 0.0–0.1)
Immature Granulocytes: 0 %
Lymphocytes Absolute: 2.9 10*3/uL (ref 0.7–3.1)
Lymphs: 37 %
MCH: 28.4 pg (ref 26.6–33.0)
MCHC: 31.7 g/dL (ref 31.5–35.7)
MCV: 90 fL (ref 79–97)
Monocytes Absolute: 0.8 10*3/uL (ref 0.1–0.9)
Monocytes: 10 %
Neutrophils Absolute: 4.1 10*3/uL (ref 1.4–7.0)
Neutrophils: 50 %
Platelets: 306 10*3/uL (ref 150–450)
RBC: 4.82 x10E6/uL (ref 3.77–5.28)
RDW: 13 % (ref 11.7–15.4)
WBC: 7.9 10*3/uL (ref 3.4–10.8)

## 2021-04-25 LAB — VITAMIN D 25 HYDROXY (VIT D DEFICIENCY, FRACTURES): Vit D, 25-Hydroxy: 45 ng/mL (ref 30.0–100.0)

## 2021-04-25 LAB — LIPID PANEL WITH LDL/HDL RATIO
Cholesterol, Total: 207 mg/dL — ABNORMAL HIGH (ref 100–199)
HDL: 61 mg/dL (ref >39)
LDL Chol Calc (NIH): 119 mg/dL — ABNORMAL HIGH (ref 0–99)
LDL/HDL Ratio: 2 ratio (ref 0.0–3.2)
Triglycerides: 156 mg/dL — ABNORMAL HIGH (ref 0–149)
VLDL Cholesterol Cal: 27 mg/dL (ref 5–40)

## 2021-04-25 LAB — TSH: TSH: 1.82 u[IU]/mL (ref 0.450–4.500)

## 2021-05-07 ENCOUNTER — Telehealth: Payer: Self-pay

## 2021-05-07 NOTE — Telephone Encounter (Signed)
[facility] faxed refill request for the following medications:  sertraline (ZOLOFT) 100 MG tablet   Not on current medication list  Please advise.

## 2021-05-07 NOTE — Telephone Encounter (Signed)
Please review if okay to disregard refill request. Medication was discontinued by Joycelyn Man on 05/02/20 under comments it states patient preference. KW

## 2021-05-09 ENCOUNTER — Other Ambulatory Visit: Payer: Self-pay

## 2022-01-22 ENCOUNTER — Other Ambulatory Visit: Payer: Self-pay | Admitting: Family Medicine

## 2022-01-22 DIAGNOSIS — K219 Gastro-esophageal reflux disease without esophagitis: Secondary | ICD-10-CM

## 2022-01-23 NOTE — Telephone Encounter (Signed)
Pt has refills available. Early request sent via Interface Requested Prescriptions  Pending Prescriptions Disp Refills  . omeprazole (PRILOSEC) 20 MG capsule [Pharmacy Med Name: Omeprazole 20 MG Oral Capsule Delayed Release] 90 capsule 3    Sig: TAKE 1 CAPSULE BY MOUTH  DAILY     Gastroenterology: Proton Pump Inhibitors Passed - 01/22/2022  3:41 PM      Passed - Valid encounter within last 12 months    Recent Outpatient Visits          9 months ago Annual physical exam   [facility], Jodell Cipro, PA-C   10 months ago Breast lump   [facility] Vigg, Avanti, MD   1 year ago Hematoma   [facility] [place], Marzella Schlein, MD   1 year ago Annual physical exam   [facility] Rosezetta Schlatter, Alessandra Bevels, [place]   3 years ago Annual physical exam   [facility] Joycelyn Man M, [place]

## 2022-01-24 ENCOUNTER — Other Ambulatory Visit: Payer: Self-pay | Admitting: Family Medicine

## 2022-01-24 DIAGNOSIS — K219 Gastro-esophageal reflux disease without esophagitis: Secondary | ICD-10-CM

## 2022-06-13 NOTE — Progress Notes (Signed)
Complete physical exam  Patient: Kayla English   DOB: 1961-03-29   61 y.o. Female  MRN: [id number] Visit Date: 06/19/2022  Today's healthcare provider: Gwyneth Sprout, FNP  Patient presents for new patient visit to establish care.  Introduced to Designer, jewellery role and practice setting.  All questions answered.  Discussed provider/patient relationship and expectations.  I,Tiffany J Bragg,acting as a scribe for Gwyneth Sprout, FNP.,have documented all relevant documentation on the behalf of Gwyneth Sprout, FNP,as directed by  Gwyneth Sprout, FNP while in the presence of Gwyneth Sprout, FNP.  Chief Complaint  Patient presents with   Annual Exam   Subjective    Kayla English is a 61 y.o. female who presents today for a complete physical exam.  She reports consuming a  low calorie  diet. The patient has a physically strenuous job, but has no regular exercise apart from work.  She generally feels well. She reports sleeping fairly well. She does not have additional problems to discuss today.   HPI   Past Medical History:  Diagnosis Date   Avitaminosis D    Back pain    BRCA negative 10/2017   MyRisk neg; riskscore=6.4%/IBIS=9.6%   Breast nodule    Cerumen impaction    Depression    Family history of ovarian cancer    10/18 cancer genetic testing letter sent   Fatigue    Fibrocystic breast    Fibromyalgia    Headache    Insomnia    Irregular menses    Migraine    Osteoarthritis    Pelvic pain in female    Pelvic pain in female    Polycystic ovaries    Rash    Sinusitis    Snoring    TMJ arthritis    Transient adjustment reaction with anxiety    Trochanteric bursitis    Urinary frequency    UTI (lower urinary tract infection)    Weight gain    Past Surgical History:  Procedure Laterality Date   CESAREAN SECTION  1977   neck disc sugery  2012   SHOULDER SURGERY  2009   Social History   Socioeconomic History   Marital status: Married    Spouse name: Not on  file   Number of children: Not on file   Years of education: Not on file   Highest education level: Not on file  Occupational History   Not on file  Tobacco Use   Smoking status: Never   Smokeless tobacco: Never  Vaping Use   Vaping Use: Never used  Substance and Sexual Activity   Alcohol use: No    Alcohol/week: 0.0 standard drinks of alcohol   Drug use: No   Sexual activity: Yes  Other Topics Concern   Not on file  Social History Narrative   Not on file   Social Determinants of Health   Financial Resource Strain: Not on file  Food Insecurity: Not on file  Transportation Needs: Not on file  Physical Activity: Not on file  Stress: Not on file  Social Connections: Not on file  Intimate Partner Violence: Not on file   Family Status  Relation Name Status   Brother  (Not Specified)   PGM  (Not Specified)   PGF  (Not Specified)   MGF  (Not Specified)   Family History  Problem Relation Age of Onset   Hypertension Brother    Ovarian cancer Paternal Grandmother 24   Prostate cancer Paternal  Grandfather    Lung cancer Maternal Grandfather    No Known Allergies  Patient Care Team: Gwyneth Sprout, FNP as PCP - General (Family Medicine)   Medications: Outpatient Medications Prior to Visit  Medication Sig   acetaminophen (TYLENOL) 325 MG tablet Take by mouth. Reported on 03/04/2016   Calcium Carbonate-Vitamin D (CALCIUM-VITAMIN D) 500-200 MG-UNIT tablet Take by mouth.   ibuprofen (ADVIL,MOTRIN) 400 MG tablet Take 400 mg by mouth every 6 (six) hours as needed.   Multiple Vitamin (MULTI-VITAMINS) TABS Take by mouth.   Probiotic Product (PROBIOTIC & ACIDOPHILUS EX ST PO) Take by mouth.   SUMAtriptan (IMITREX) 100 MG tablet Take 100 mg by mouth every 2 (two) hours as needed for migraine. May repeat in 2 hours if headache persists or recurs.   topiramate (TOPAMAX) 100 MG tablet Take 200 mg by mouth 2 (two) times daily.   Vitamin D, Cholecalciferol, 1000 UNITS CAPS Take by  mouth.   [DISCONTINUED] DULoxetine (CYMBALTA) 30 MG capsule TAKE 1 CAPSULE BY MOUTH  DAILY. TAKE WITH $RemoveB'60MG'PmhRbiAc$   CAPSULE DAILY FOR TOTAL OF  $R'90MG'uQ$    [DISCONTINUED] omeprazole (PRILOSEC) 20 MG capsule TAKE 1 CAPSULE BY MOUTH  DAILY   [DISCONTINUED] DULoxetine (CYMBALTA) 60 MG capsule Take 1 capsule (60 mg total) by mouth daily. (Patient not taking: Reported on 06/19/2022)   No facility-administered medications prior to visit.   Review of Systems  HENT:  Positive for congestion.   Musculoskeletal:  Positive for arthralgias, back pain and neck pain.  Hematological:  Bruises/bleeds easily.    Objective    BP (!) 138/92 (BP Location: Right Arm, Patient Position: Sitting, Cuff Size: Normal)   Pulse 92   Resp 16   Ht $R'5\' 4"'HO$  (1.626 m)   Wt 192 lb (87.1 kg)   SpO2 100%   BMI 32.96 kg/m   Physical Exam Vitals and nursing note reviewed.  Constitutional:      General: She is awake. She is not in acute distress.    Appearance: Normal appearance. She is well-developed and well-groomed. She is obese. She is not ill-appearing, toxic-appearing or diaphoretic.  HENT:     Head: Normocephalic and atraumatic.     Jaw: There is normal jaw occlusion. No trismus, tenderness, swelling or pain on movement.     Right Ear: Hearing, tympanic membrane, ear canal and external ear normal. There is no impacted cerumen.     Left Ear: Hearing, tympanic membrane, ear canal and external ear normal. There is no impacted cerumen.     Nose: Nose normal. No congestion or rhinorrhea.     Right Turbinates: Not enlarged, swollen or pale.     Left Turbinates: Not enlarged, swollen or pale.     Right Sinus: No maxillary sinus tenderness or frontal sinus tenderness.     Left Sinus: No maxillary sinus tenderness or frontal sinus tenderness.     Mouth/Throat:     Lips: Pink.     Mouth: Mucous membranes are moist. No injury.     Tongue: No lesions.     Pharynx: Oropharynx is clear. Uvula midline. No pharyngeal swelling,  oropharyngeal exudate, posterior oropharyngeal erythema or uvula swelling.     Tonsils: No tonsillar exudate or tonsillar abscesses.  Eyes:     General: Lids are normal. Lids are everted, no foreign bodies appreciated. Vision grossly intact. Gaze aligned appropriately. No allergic shiner or visual field deficit.       Right eye: No discharge.        Left  eye: No discharge.     Extraocular Movements: Extraocular movements intact.     Conjunctiva/sclera: Conjunctivae normal.     Right eye: Right conjunctiva is not injected. No exudate.    Left eye: Left conjunctiva is not injected. No exudate.    Pupils: Pupils are equal, round, and reactive to light.  Neck:     Thyroid: No thyroid mass, thyromegaly or thyroid tenderness.     Vascular: No carotid bruit.     Trachea: Trachea normal.  Cardiovascular:     Rate and Rhythm: Normal rate and regular rhythm.     Pulses: Normal pulses.          Carotid pulses are 2+ on the right side and 2+ on the left side.      Radial pulses are 2+ on the right side and 2+ on the left side.       Dorsalis pedis pulses are 2+ on the right side and 2+ on the left side.       Posterior tibial pulses are 2+ on the right side and 2+ on the left side.     Heart sounds: Normal heart sounds, S1 normal and S2 normal. No murmur heard.    No friction rub. No gallop.  Pulmonary:     Effort: Pulmonary effort is normal. No respiratory distress.     Breath sounds: Normal breath sounds and air entry. No stridor. No wheezing, rhonchi or rales.  Chest:     Chest wall: No tenderness.  Abdominal:     General: Abdomen is flat. Bowel sounds are normal. There is no distension.     Palpations: Abdomen is soft. There is no mass.     Tenderness: There is no abdominal tenderness. There is no right CVA tenderness, left CVA tenderness, guarding or rebound.     Hernia: No hernia is present.  Genitourinary:    Comments: Exam deferred; denies complaints Musculoskeletal:        General:  No swelling, tenderness, deformity or signs of injury. Normal range of motion.     Cervical back: Full passive range of motion without pain, normal range of motion and neck supple. No edema, rigidity or tenderness. No muscular tenderness.     Right lower leg: No edema.     Left lower leg: No edema.  Lymphadenopathy:     Cervical: No cervical adenopathy.     Right cervical: No superficial, deep or posterior cervical adenopathy.    Left cervical: No superficial, deep or posterior cervical adenopathy.  Skin:    General: Skin is warm and dry.     Capillary Refill: Capillary refill takes less than 2 seconds.     Coloration: Skin is not jaundiced or pale.     Findings: No bruising, erythema, lesion or rash.  Neurological:     General: No focal deficit present.     Mental Status: She is alert and oriented to person, place, and time. Mental status is at baseline.     GCS: GCS eye subscore is 4. GCS verbal subscore is 5. GCS motor subscore is 6.     Sensory: Sensation is intact. No sensory deficit.     Motor: Motor function is intact. No weakness.     Coordination: Coordination is intact. Coordination normal.     Gait: Gait is intact. Gait normal.  Psychiatric:        Attention and Perception: Attention and perception normal.        Mood and Affect: Mood and  affect normal.        Speech: Speech normal.        Behavior: Behavior normal. Behavior is cooperative.        Thought Content: Thought content normal.        Cognition and Memory: Cognition and memory normal.        Judgment: Judgment normal.     Last depression screening scores    06/19/2022    8:48 AM 04/24/2021    1:23 PM 03/02/2021    8:16 AM  PHQ 2/9 Scores  PHQ - 2 Score 0 0 0  PHQ- 9 Score 5 2    Last fall risk screening    06/19/2022    8:48 AM  Fall Risk   Falls in the past year? 0  Number falls in past yr: 0  Injury with Fall? 0  Risk for fall due to : No Fall Risks  Follow up Falls evaluation completed   Last  Audit-C alcohol use screening    06/19/2022    8:49 AM  Alcohol Use Disorder Test (AUDIT)  1. How often do you have a drink containing alcohol? 0  2. How many drinks containing alcohol do you have on a typical day when you are drinking? 0  3. How often do you have six or more drinks on one occasion? 0  AUDIT-C Score 0   A score of 3 or more in women, and 4 or more in men indicates increased risk for alcohol abuse, EXCEPT if all of the points are from question 1   No results found for any visits on 06/19/22.  Assessment & Plan    Routine Health Maintenance and Physical Exam  Exercise Activities and Dietary recommendations  Goals   None     Immunization History  Administered Date(s) Administered   Influenza Split 07/10/2011, 06/24/2012   Influenza,inj,Quad PF,6+ Mos 07/30/2013, 05/12/2014, 06/10/2016, 05/13/2017, 06/15/2018, 04/21/2020   Moderna Sars-Covid-2 Vaccination 10/01/2019, 10/29/2019, 07/04/2020   PFIZER(Purple Top)SARS-COV-2 Vaccination 01/20/2021   Td 06/01/2003   Tdap 11/08/2009    Health Maintenance  Topic Date Due   COVID-19 Vaccine (5 - Moderna series) 07/05/2022 (Originally 03/17/2021)   Zoster Vaccines- Shingrix (1 of 2) 09/19/2022 (Originally 05/07/2009)   INFLUENZA VACCINE  11/17/2022 (Originally 03/19/2022)   TETANUS/TDAP  06/20/2023 (Originally 11/09/2019)   MAMMOGRAM  03/16/2023   PAP SMEAR-Modifier  04/22/2023   Fecal DNA (Cologuard)  06/23/2023   Hepatitis C Screening  Completed   HIV Screening  Completed   HPV VACCINES  Aged Out   COLONOSCOPY (Pts 45-59yr Insurance coverage will need to be confirmed)  Discontinued    Discussed health benefits of physical activity, and encouraged her to engage in regular exercise appropriate for her age and condition.  Problem List Items Addressed This Visit       Digestive   Gastroesophageal reflux disease with esophagitis without hemorrhage    Chronic, stable Continue prilosec to assist       Relevant  Medications   omeprazole (PRILOSEC) 20 MG capsule     Other   Annual physical exam - Primary    UTD on dental; will see eye Dr in 2024 Things to do to keep yourself healthy  - Exercise at least 30-45 minutes a day, 3-4 days a week.  - Eat a low-fat diet with lots of fruits and vegetables, up to 7-9 servings per day.  - Seatbelts can save your life. Wear them always.  - Smoke detectors on every level of  your home, check batteries every year.  - Eye Doctor - have an eye exam every 1-2 years  - Safe sex - if you may be exposed to STDs, use a condom.  - Alcohol -  If you drink, do it moderately, less than 2 drinks per day.  - [place]. Choose someone to speak for you if you are not able.  - Depression is common in our stressful world.If you're feeling down or losing interest in things you normally enjoy, please come in for a visit.  - Violence - If anyone is threatening or hurting you, please call immediately.  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       Relevant Orders   CBC with Differential/Platelet   Comprehensive Metabolic Panel (CMET)   Lipid panel   Avitaminosis D    Chronic, previously stable Taking MVI and Vit D supplement      Relevant Orders   Vitamin D (25 hydroxy)   Chronic pain syndrome    Chronic, stable Due for refills of Cymalta 90 mg  Request for local fill and mail order for 1 year      Relevant Medications   DULoxetine (CYMBALTA) 30 MG capsule   DULoxetine (CYMBALTA) 30 MG capsule   Elevated blood pressure reading in office without diagnosis of hypertension    BP borderline with readings high 130s/ low 90s Family hx of HTN in mom and two brothers Does not wish to start medication at this time; agreeable to lifestyle management to assist Hand outs provided for HTN mgmt and DASH diet      Screening for diabetes mellitus    Recommend screening for DM with BMI >30 and elevated BP w/o dx of HTN       Relevant Orders   Hemoglobin A1c   Screening for thyroid disorder    Request for thyroid screening with CPE in s/o BP slightly elevated in office       Relevant Orders   TSH + free T4   Screening mammogram for breast cancer    Due for screening for mammogram, denies breast concerns, provided with phone number to call and schedule appointment for mammogram. Encouraged to repeat breast cancer screening every 1-2 years.       Relevant Orders   MM 3D SCREEN BREAST BILATERAL   Return in about 6 months (around 12/18/2022) for chonic disease management.    Vonna Kotyk, FNP, have reviewed all documentation for this visit. The documentation on 06/19/22 for the exam, diagnosis, procedures, and orders are all accurate and complete.  Gwyneth Sprout, [place] [phone] (phone) [phone] (fax)  [place]

## 2022-06-19 ENCOUNTER — Ambulatory Visit (INDEPENDENT_AMBULATORY_CARE_PROVIDER_SITE_OTHER): Payer: Managed Care, Other (non HMO) | Admitting: Family Medicine

## 2022-06-19 ENCOUNTER — Encounter: Payer: Self-pay | Admitting: Family Medicine

## 2022-06-19 VITALS — BP 138/92 | HR 92 | Resp 16 | Ht 64.0 in | Wt 192.0 lb

## 2022-06-19 DIAGNOSIS — K2100 Gastro-esophageal reflux disease with esophagitis, without bleeding: Secondary | ICD-10-CM | POA: Diagnosis not present

## 2022-06-19 DIAGNOSIS — R030 Elevated blood-pressure reading, without diagnosis of hypertension: Secondary | ICD-10-CM

## 2022-06-19 DIAGNOSIS — R7401 Elevation of levels of liver transaminase levels: Secondary | ICD-10-CM | POA: Diagnosis not present

## 2022-06-19 DIAGNOSIS — G894 Chronic pain syndrome: Secondary | ICD-10-CM | POA: Diagnosis not present

## 2022-06-19 DIAGNOSIS — E559 Vitamin D deficiency, unspecified: Secondary | ICD-10-CM | POA: Diagnosis not present

## 2022-06-19 DIAGNOSIS — Z1329 Encounter for screening for other suspected endocrine disorder: Secondary | ICD-10-CM

## 2022-06-19 DIAGNOSIS — Z0000 Encounter for general adult medical examination without abnormal findings: Secondary | ICD-10-CM

## 2022-06-19 DIAGNOSIS — Z1231 Encounter for screening mammogram for malignant neoplasm of breast: Secondary | ICD-10-CM

## 2022-06-19 DIAGNOSIS — Z131 Encounter for screening for diabetes mellitus: Secondary | ICD-10-CM

## 2022-06-19 MED ORDER — OMEPRAZOLE 20 MG PO CPDR: 20.0000 mg | DELAYED_RELEASE_CAPSULE | Freq: Every day | ORAL | 3 refills | Status: DC

## 2022-06-19 MED ORDER — DULOXETINE HCL 30 MG PO CPEP: 90.0000 mg | ORAL_CAPSULE | Freq: Every day | ORAL | 0 refills | Status: DC

## 2022-06-19 MED ORDER — DULOXETINE HCL 30 MG PO CPEP: 90.0000 mg | ORAL_CAPSULE | Freq: Every day | ORAL | 3 refills | Status: DC

## 2022-06-19 NOTE — Assessment & Plan Note (Signed)
UTD on dental; will see eye Dr in 2024 Things to do to keep yourself healthy  - Exercise at least 30-45 minutes a day, 3-4 days a week.  - Eat a low-fat diet with lots of fruits and vegetables, up to 7-9 servings per day.  - Seatbelts can save your life. Wear them always.  - Smoke detectors on every level of your home, check batteries every year.  - Eye Doctor - have an eye exam every 1-2 years  - Safe sex - if you may be exposed to STDs, use a condom.  - Alcohol -  If you drink, do it moderately, less than 2 drinks per day.  - [place]. Choose someone to speak for you if you are not able.  - Depression is common in our stressful world.If you're feeling down or losing interest in things you normally enjoy, please come in for a visit.  - Violence - If anyone is threatening or hurting you, please call immediately.  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

## 2022-06-19 NOTE — Assessment & Plan Note (Signed)
Request for thyroid screening with CPE in s/o BP slightly elevated in office

## 2022-06-19 NOTE — Assessment & Plan Note (Signed)
Chronic, stable Due for refills of Cymalta 90 mg  Request for local fill and mail order for 1 year

## 2022-06-19 NOTE — Assessment & Plan Note (Signed)
Chronic, stable Continue prilosec to assist

## 2022-06-19 NOTE — Assessment & Plan Note (Signed)
Recommend screening for DM with BMI >30 and elevated BP w/o dx of HTN

## 2022-06-19 NOTE — Assessment & Plan Note (Signed)
BP borderline with readings high 130s/ low 90s Family hx of HTN in mom and two brothers Does not wish to start medication at this time; agreeable to lifestyle management to assist Hand outs provided for HTN mgmt and DASH diet

## 2022-06-19 NOTE — Patient Instructions (Signed)
Please call and schedule your mammogram:  [facility]  [address], [address] [facility] [place],  [place]  [address] Get Driving Directions Main: [phone]  Sunday:Closed Monday:7:20 AM - 5:00 PM Tuesday:7:20 AM - 5:00 PM Wednesday:7:20 AM - 5:00 PM Thursday:7:20 AM - 5:00 PM Friday:7:20 AM - 4:30 PM Saturday:Closed  

## 2022-06-19 NOTE — Assessment & Plan Note (Signed)
Due for screening for mammogram, denies breast concerns, provided with phone number to call and schedule appointment for mammogram. Encouraged to repeat breast cancer screening every 1-2 years.  

## 2022-06-19 NOTE — Assessment & Plan Note (Signed)
Chronic, previously stable Taking MVI and Vit D supplement

## 2022-06-20 LAB — CBC WITH DIFFERENTIAL/PLATELET
Basophils Absolute: 0.1 10*3/uL (ref 0.0–0.2)
Basos: 1 %
EOS (ABSOLUTE): 0.2 10*3/uL (ref 0.0–0.4)
Eos: 3 %
Hematocrit: 43.1 % (ref 34.0–46.6)
Hemoglobin: 13.9 g/dL (ref 11.1–15.9)
Immature Grans (Abs): 0 10*3/uL (ref 0.0–0.1)
Immature Granulocytes: 0 %
Lymphocytes Absolute: 1.8 10*3/uL (ref 0.7–3.1)
Lymphs: 29 %
MCH: 28.8 pg (ref 26.6–33.0)
MCHC: 32.3 g/dL (ref 31.5–35.7)
MCV: 89 fL (ref 79–97)
Monocytes Absolute: 0.8 10*3/uL (ref 0.1–0.9)
Monocytes: 14 %
Neutrophils Absolute: 3.3 10*3/uL (ref 1.4–7.0)
Neutrophils: 53 %
Platelets: 294 10*3/uL (ref 150–450)
RBC: 4.82 x10E6/uL (ref 3.77–5.28)
RDW: 12.6 % (ref 11.7–15.4)
WBC: 6.2 10*3/uL (ref 3.4–10.8)

## 2022-06-20 LAB — HEMOGLOBIN A1C
Est. average glucose Bld gHb Est-mCnc: 111 mg/dL
Hgb A1c MFr Bld: 5.5 % (ref 4.8–5.6)

## 2022-06-20 LAB — COMPREHENSIVE METABOLIC PANEL
ALT: 33 IU/L — ABNORMAL HIGH (ref 0–32)
AST: 26 IU/L (ref 0–40)
Albumin/Globulin Ratio: 1.7 (ref 1.2–2.2)
Albumin: 4.5 g/dL (ref 3.9–4.9)
Alkaline Phosphatase: 71 IU/L (ref 44–121)
BUN/Creatinine Ratio: 14 (ref 12–28)
BUN: 16 mg/dL (ref 8–27)
Bilirubin Total: 0.3 mg/dL (ref 0.0–1.2)
CO2: 20 mmol/L (ref 20–29)
Calcium: 9.6 mg/dL (ref 8.7–10.3)
Chloride: 108 mmol/L — ABNORMAL HIGH (ref 96–106)
Creatinine, Ser: 1.11 mg/dL — ABNORMAL HIGH (ref 0.57–1.00)
Globulin, Total: 2.7 g/dL (ref 1.5–4.5)
Glucose: 109 mg/dL — ABNORMAL HIGH (ref 70–99)
Potassium: 4.2 mmol/L (ref 3.5–5.2)
Sodium: 143 mmol/L (ref 134–144)
Total Protein: 7.2 g/dL (ref 6.0–8.5)
eGFR: 56 mL/min/{1.73_m2} — ABNORMAL LOW (ref >59)

## 2022-06-20 LAB — LIPID PANEL
Chol/HDL Ratio: 3.3 ratio (ref 0.0–4.4)
Cholesterol, Total: 205 mg/dL — ABNORMAL HIGH (ref 100–199)
HDL: 63 mg/dL (ref >39)
LDL Chol Calc (NIH): 118 mg/dL — ABNORMAL HIGH (ref 0–99)
Triglycerides: 137 mg/dL (ref 0–149)
VLDL Cholesterol Cal: 24 mg/dL (ref 5–40)

## 2022-06-20 LAB — TSH+FREE T4
Free T4: 0.88 ng/dL (ref 0.82–1.77)
TSH: 2.19 u[IU]/mL (ref 0.450–4.500)

## 2022-06-20 LAB — VITAMIN D 25 HYDROXY (VIT D DEFICIENCY, FRACTURES): Vit D, 25-Hydroxy: 82 ng/mL (ref 30.0–100.0)

## 2022-06-20 NOTE — Progress Notes (Signed)
Noted, labs ordered.

## 2022-06-20 NOTE — Progress Notes (Signed)
Blood chemistry shows slight decrease in kidney function from sampling last year. Ensure you are drinking 48-64 oz of water daily to assist. Can repeat in 3-6 months to see if elevation improves.  Cholesterol remains elevated; both total and bad/LDL. Stroke/heart attack risk is low at 5% in 10 years. I continue to recommend diet low in saturated fat and regular exercise - 30 min at least 5 times per week. 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 Pre-diabetes, back in normal range. Continue to recommend balanced, lower carb meals. Smaller meal size, adding snacks. Choosing water as drink of choice and increasing purposeful exercise.  Gwyneth Sprout, [place] [address] [place], [place] [address] [phone] (phone) [phone] (fax) [place]

## 2022-06-20 NOTE — Addendum Note (Signed)
Addended by: Smitty Knudsen on: 06/20/2022 01:59 PM   Modules accepted: Orders

## 2022-07-29 ENCOUNTER — Ambulatory Visit: Payer: Managed Care, Other (non HMO) | Admitting: Physician Assistant

## 2022-07-29 ENCOUNTER — Encounter: Payer: Self-pay | Admitting: Physician Assistant

## 2022-07-29 VITALS — BP 128/80 | HR 108 | Wt 190.9 lb

## 2022-07-29 DIAGNOSIS — R32 Unspecified urinary incontinence: Secondary | ICD-10-CM | POA: Diagnosis not present

## 2022-07-29 DIAGNOSIS — R82998 Other abnormal findings in urine: Secondary | ICD-10-CM

## 2022-07-29 DIAGNOSIS — R300 Dysuria: Secondary | ICD-10-CM | POA: Diagnosis not present

## 2022-07-29 LAB — POCT URINALYSIS DIPSTICK
Bilirubin, UA: NEGATIVE
Blood, UA: NEGATIVE
Glucose, UA: NEGATIVE
Ketones, UA: NEGATIVE
Nitrite, UA: NEGATIVE
Protein, UA: POSITIVE — AB
Spec Grav, UA: >=1.03 — AB (ref 1.010–1.025)
Urobilinogen, UA: 0.2 E.U./dL
pH, UA: 6 (ref 5.0–8.0)

## 2022-07-29 MED ORDER — SULFAMETHOXAZOLE-TRIMETHOPRIM 800-160 MG PO TABS: 1.0000 | ORAL_TABLET | Freq: Two times a day (BID) | ORAL | 0 refills | Status: DC

## 2022-07-29 NOTE — Progress Notes (Signed)
I,Sha'taria Tyson,acting as a Neurosurgeon for [employer], PA-C.,have documented all relevant documentation on the behalf of Debera Lat, PA-C,as directed by  [employer], PA-C while in the presence of [employer], PA-C.   Established patient visit   Patient: Kayla English   DOB: Sep 16, 1960   61 y.o. Female  MRN: [id number] Visit Date: 07/29/2022  Today's healthcare provider: Debera Lat, PA-C   CC: UTI  Subjective    Urinary Tract Infection  This is a new problem. The current episode started 1 to 4 weeks ago (2 weeks). The problem occurs every urination. The problem has been gradually worsening. The quality of the pain is described as aching. There has been no fever. Associated symptoms comments: Burning while urinating and a little after. Treatments tried: azo and cranberry juice. Her past medical history is significant for recurrent UTIs.  Pt denies having hx of kidney stones Pt reports having stress urinary incontinence     Medications: Outpatient Medications Prior to Visit  Medication Sig   acetaminophen (TYLENOL) 325 MG tablet Take by mouth. Reported on 03/04/2016   Calcium Carbonate-Vitamin D (CALCIUM-VITAMIN D) 500-200 MG-UNIT tablet Take by mouth.   DULoxetine (CYMBALTA) 30 MG capsule Take 3 capsules (90 mg total) by mouth daily.   ibuprofen (ADVIL,MOTRIN) 400 MG tablet Take 400 mg by mouth every 6 (six) hours as needed.   Multiple Vitamin (MULTI-VITAMINS) TABS Take by mouth.   omeprazole (PRILOSEC) 20 MG capsule Take 1 capsule (20 mg total) by mouth daily.   Probiotic Product (PROBIOTIC & ACIDOPHILUS EX ST PO) Take by mouth.   SUMAtriptan (IMITREX) 100 MG tablet Take 100 mg by mouth every 2 (two) hours as needed for migraine. May repeat in 2 hours if headache persists or recurs.   topiramate (TOPAMAX) 100 MG tablet Take 200 mg by mouth 2 (two) times daily.   Vitamin D, Cholecalciferol, 1000 UNITS CAPS Take by mouth.   DULoxetine (CYMBALTA) 30 MG capsule Take 3  capsules (90 mg total) by mouth daily. (Patient not taking: Reported on 07/29/2022)   No facility-administered medications prior to visit.    Review of Systems  All other systems reviewed and are negative. Except see HPI     Objective    BP 128/80 (BP Location: Left Arm, Patient Position: Sitting, Cuff Size: Large)   Pulse (!) 108   Wt 190 lb 14.4 oz (86.6 kg)   SpO2 100%   BMI 32.77 kg/m    Physical Exam Vitals reviewed.  Constitutional:      General: She is not in acute distress.    Appearance: She is well-developed.  HENT:     Head: Normocephalic and atraumatic.  Eyes:     General: No scleral icterus.    Conjunctiva/sclera: Conjunctivae normal.  Cardiovascular:     Rate and Rhythm: Normal rate and regular rhythm.     Heart sounds: Normal heart sounds. No murmur heard. Pulmonary:     Effort: Pulmonary effort is normal. No respiratory distress.     Breath sounds: Normal breath sounds. No wheezing or rales.  Abdominal:     General: There is no distension.     Palpations: Abdomen is soft.     Tenderness: There is abdominal tenderness in the suprapubic area. There is no guarding or rebound.  Skin:    General: Skin is warm and dry.     Capillary Refill: Capillary refill takes less than 2 seconds.     Findings: No rash.  Neurological:  Mental Status: She is alert and oriented to person, place, and time.  Psychiatric:        Behavior: Behavior normal.       Results for orders placed or performed in visit on 07/29/22  POCT urinalysis dipstick  Result Value Ref Range   Color, UA     Clarity, UA     Glucose, UA Negative Negative   Bilirubin, UA negative    Ketones, UA negative    Spec Grav, UA >=1.030 (A) 1.010 - 1.025   Blood, UA negative    pH, UA 6.0 5.0 - 8.0   Protein, UA Positive (A) Negative   Urobilinogen, UA 0.2 0.2 or 1.0 E.U./dL   Nitrite, UA negative    Leukocytes, UA Moderate (2+) (A) Negative   Appearance     Odor      Assessment & Plan      1. Dysuria 2. Urine leukocytes 3. Burning with urination Acute. Most likely due to UTI No fever or flank pain. POCT UA dipstick showed leukocytes and trace of protein. Has been taking Azo for the past two weeks.  Advised to d/c Advised to drink a lot of water, cranberry juice. Pt has been having a hx of frequent of UTI  Last UTI 2 years ago - Ambulatory referral to Urogynecology for stress incontinence and possible pelvic  floor PT if appropriate - sulfamethoxazole-trimethoprim (BACTRIM DS) 800-160 MG tablet; Take 1 tablet by mouth 2 (two) times daily.  Dispense: 20 tablet; Refill: 0 Pt was advised to stop if her symptoms improve after 5-7 days. Advised to take probiotics. - Urine Culture pending - Urine Microscopic pending Pt was explained that we might switch or d/c her current antibiotic depending on urine culture results.  4. Urinary incontinence, unspecified type CST: immediate ur leakage on coughing Hx of cesarean section Comorbidities: sleep apnea, depression. Long-term use of antidepressants Pt was advised to proceed with Kegel exercises - Ambulatory referral to Urogynecology Will Fu with her PCP  The patient was advised to call back or seek an in-person evaluation if the symptoms worsen or if the condition fails to improve as anticipated.  I discussed the assessment and treatment plan with the patient. The patient was provided an opportunity to ask questions and all were answered. The patient agreed with the plan and demonstrated an understanding of the instructions.  The entirety of the information documented in the History of Present Illness, Review of Systems and Physical Exam were personally obtained by me. Portions of this information were initially documented by the CMA and reviewed by me for thoroughness and accuracy.   Debera Lat, [facility], MMS [facility] [phone] (phone) [phone] (fax)

## 2022-07-30 LAB — URINALYSIS, MICROSCOPIC ONLY

## 2022-07-30 LAB — SPECIMEN STATUS REPORT

## 2022-07-31 LAB — URINALYSIS, MICROSCOPIC ONLY
Bacteria, UA: NONE SEEN
Epithelial Cells (non renal): >10 /hpf — AB (ref 0–10)

## 2022-07-31 LAB — URINE CULTURE

## 2022-07-31 LAB — SPECIMEN STATUS REPORT

## 2022-07-31 NOTE — Progress Notes (Signed)
Please, let pt know that her labs are back and showed no bacteria growth but she has casts in her urine and calcium oxalate, so she was advised to continue with her current abx for 7 days and drink a lot of water daily. Please, call and schedule a FU appt to recheck your Urine culture and CMP after completion of course of antibiotics.

## 2022-08-08 ENCOUNTER — Ambulatory Visit: Payer: Managed Care, Other (non HMO) | Admitting: Urology

## 2022-08-08 ENCOUNTER — Encounter: Payer: Self-pay | Admitting: Urology

## 2022-08-08 VITALS — BP 114/80 | HR 120 | Ht 64.0 in | Wt 194.0 lb

## 2022-08-08 DIAGNOSIS — N958 Other specified menopausal and perimenopausal disorders: Secondary | ICD-10-CM

## 2022-08-08 DIAGNOSIS — Z87440 Personal history of urinary (tract) infections: Secondary | ICD-10-CM | POA: Diagnosis not present

## 2022-08-08 DIAGNOSIS — R3915 Urgency of urination: Secondary | ICD-10-CM

## 2022-08-08 DIAGNOSIS — N390 Urinary tract infection, site not specified: Secondary | ICD-10-CM

## 2022-08-08 DIAGNOSIS — R300 Dysuria: Secondary | ICD-10-CM | POA: Diagnosis not present

## 2022-08-08 DIAGNOSIS — R350 Frequency of micturition: Secondary | ICD-10-CM

## 2022-08-08 LAB — URINALYSIS, COMPLETE
Bilirubin, UA: NEGATIVE
Glucose, UA: NEGATIVE
Ketones, UA: NEGATIVE
Leukocytes,UA: NEGATIVE
Nitrite, UA: NEGATIVE
Protein,UA: NEGATIVE
RBC, UA: NEGATIVE
Specific Gravity, UA: 1.025 (ref 1.005–1.030)
Urobilinogen, Ur: 0.2 mg/dL (ref 0.2–1.0)
pH, UA: 6 (ref 5.0–7.5)

## 2022-08-08 LAB — MICROSCOPIC EXAMINATION: Epithelial Cells (non renal): >10 /hpf — AB (ref 0–10)

## 2022-08-08 LAB — BLADDER SCAN AMB NON-IMAGING

## 2022-08-08 MED ORDER — ESTRADIOL 0.1 MG/GM VA CREA: TOPICAL_CREAM | VAGINAL | 12 refills | Status: AC

## 2022-08-08 NOTE — Progress Notes (Signed)
08/08/22 10:30 AM   Kayla English 19-Mar-1961 [id number]  CC: Recurrent UTI, urinary symptoms  HPI: I saw Kayla English for the above issues.  She reports a long history of 3-4 UTI type episodes a year that she self treats with cranberry and Azo.  She recently saw her PCP on 07/29/2022 with dysuria, dipstick urinalysis showed moderate leukocytes but culture was ultimately negative, and she was treated with Bactrim with significant improvement in her symptoms.  Urinalysis today contaminated with greater than 10 epithelial cells and is benign with 0-5 WBC, 0-2 RBC, and asymptomatic bacteriuria with moderate bacteria, negative nitrites, negative leukocytes.  She does report some painful sexual activity and vaginal dryness.  PVR is normal at 14 mL.  She drinks a fair amount of artificially flavored waters during the day.  She also has vaginal dryness and dyspareunia.   PMH: Past Medical History:  Diagnosis Date   Avitaminosis D    Back pain    BRCA negative 10/2017   MyRisk neg; riskscore=6.4%/IBIS=9.6%   Breast nodule    Cerumen impaction    Depression    Family history of ovarian cancer    10/18 cancer genetic testing letter sent   Fatigue    Fibrocystic breast    Fibromyalgia    Headache    Insomnia    Irregular menses    Migraine    Osteoarthritis    Pelvic pain in female    Pelvic pain in female    Polycystic ovaries    Rash    Sinusitis    Snoring    TMJ arthritis    Transient adjustment reaction with anxiety    Trochanteric bursitis    Urinary frequency    UTI (lower urinary tract infection)    Weight gain     Surgical History: Past Surgical History:  Procedure Laterality Date   CESAREAN SECTION  1977   neck disc sugery  2012   SHOULDER SURGERY  2009      Family History: Family History  Problem Relation Age of Onset   Hypertension Brother    Ovarian cancer Paternal Grandmother 68   Prostate cancer Paternal Grandfather    Lung cancer Maternal  Grandfather     Social History:  reports that she has never smoked. She has never used smokeless tobacco. She reports that she does not drink alcohol and does not use drugs.  Physical Exam: BP 114/80 (BP Location: Left Arm, Patient Position: Standing, Cuff Size: Large)   Pulse (!) 120   Ht _0  (1.626 m)   Wt 194 lb (88 kg)   BMI 33.30 kg/m    Constitutional:  Alert and oriented, No acute distress. Cardiovascular: No clubbing, cyanosis, or edema. Respiratory: Normal respiratory effort, no increased work of breathing. GI: Abdomen is soft, nontender, nondistended, no abdominal masses  Laboratory Data: Reviewed  Assessment & Plan:   61 year old female with possible recurrent infections 3-4 times a year, no cultures present, recent evaluation with PCP for dysuria with negative culture and improvement on Bactrim, as well as some overactive symptoms of urgency and frequency.  Urinalysis benign today and PVR normal.  We discussed the evaluation and treatment of patients with recurrent UTIs at length.  We specifically discussed the differences between asymptomatic bacteriuria and true urinary tract infection.  We discussed the AUA definition of recurrent UTI of at least 2 culture proven symptomatic acute cystitis episodes in a 52-monthperiod, or 3 within a 1 year period.  We discussed the  importance of culture directed antibiotic treatment, and antibiotic stewardship.  First-line therapy includes nitrofurantoin(5 days), Bactrim(3 days), or fosfomycin(3 g single dose).  Possible etiologies of recurrent infection include periurethral tissue atrophy in postmenopausal woman, constipation, sexual activity, incomplete emptying, anatomic abnormalities, and even genetic predisposition.  Finally, we discussed the role of perineal hygiene, timed voiding, adequate hydration, topical vaginal estrogen, cranberry prophylaxis, and low-dose antibiotic prophylaxis.  Her symptoms are most consistent with  genitourinary syndrome of menopause(GSM), and I recommended starting topical estrogen cream.  Also encouraged cranberry tablets twice daily, avoid bladder irritants, could consider an OAB medication in the future if persistent symptoms despite above.  RTC 8 weeks symptom check   Nickolas Madrid, MD 08/08/2022  [facility] [address], [place], [place] [address] [phone]

## 2022-08-08 NOTE — Patient Instructions (Signed)
The genitourinary syndrome of menopause (GSM) is a new term that describes various menopausal symptoms and signs including not only genital symptoms (dryness, burning, and irritation), and sexual symptoms (lack of lubrication, discomfort or pain, and impaired function, but also urinary symptoms (urgency, dysuria, and recurrent urinary tract infections). The terms vulvovaginal atrophy and atrophic vaginitis, which were generally used until recently, had a limitation because they did not cover the full spectrum of symptoms and did not imply that the symptoms are related to a decreased estrogen level in menopause. Since the GSM may have a profound negative impact on the quality of life of postmenopausal women, women should be made aware of these problems and treated with an appropriate effective therapy.  The best treatment for GSM is topical estrogen cream.  This cream can improve symptoms significantly, as well as decrease risk of urinary tract infections.  Avoid artificially sweetened drinks, or at least decrease to half strength.  These artificial sweeteners can irritate your bladder and cause urgency/frequency/dysuria.  Also recommend taking cranberry tablets twice daily as this can help prevent infections and improve some urinary symptoms.

## 2022-10-02 ENCOUNTER — Ambulatory Visit: Payer: Managed Care, Other (non HMO) | Admitting: Family Medicine

## 2022-10-09 ENCOUNTER — Ambulatory Visit: Payer: Managed Care, Other (non HMO) | Admitting: Urology

## 2022-10-30 ENCOUNTER — Ambulatory Visit: Payer: Managed Care, Other (non HMO) | Admitting: Urology

## 2023-01-07 ENCOUNTER — Ambulatory Visit
Admission: RE | Admit: 2023-01-07 | Discharge: 2023-01-07 | Disposition: A | Discharge status: Home / Self Care | Payer: Managed Care, Other (non HMO) | Source: Ambulatory Visit | Attending: Family Medicine | Admitting: Family Medicine

## 2023-01-07 DIAGNOSIS — Z1231 Encounter for screening mammogram for malignant neoplasm of breast: Secondary | ICD-10-CM | POA: Diagnosis present

## 2023-01-08 NOTE — Progress Notes (Signed)
Hi Bex[place]  Normal mammogram; repeat in 1 year.  Please let us know if you have any questions.  Thank you,  Merita Norton, FNP

## 2023-03-26 ENCOUNTER — Ambulatory Visit (INDEPENDENT_AMBULATORY_CARE_PROVIDER_SITE_OTHER): Payer: Managed Care, Other (non HMO) | Admitting: Family Medicine

## 2023-03-26 DIAGNOSIS — Z91199 Patient's noncompliance with other medical treatment and regimen due to unspecified reason: Secondary | ICD-10-CM | POA: Insufficient documentation

## 2023-03-26 NOTE — Progress Notes (Signed)
Patient was not seen for appt d/t no call, no show, or late arrival >10 mins past appt time.   Strider Vallance T Danell Vazquez, FNP  [facility] [address] [place], [place] [address] [phone] (phone) [phone] (fax) [facility]  

## 2023-03-27 ENCOUNTER — Ambulatory Visit: Payer: Managed Care, Other (non HMO) | Admitting: Family Medicine

## 2023-04-03 ENCOUNTER — Encounter: Payer: Self-pay | Admitting: Family Medicine

## 2023-04-03 ENCOUNTER — Ambulatory Visit: Payer: Managed Care, Other (non HMO) | Admitting: Family Medicine

## 2023-04-03 VITALS — BP 106/68 | HR 87 | Ht 64.0 in | Wt 183.1 lb

## 2023-04-03 DIAGNOSIS — E782 Mixed hyperlipidemia: Secondary | ICD-10-CM

## 2023-04-03 DIAGNOSIS — K2100 Gastro-esophageal reflux disease with esophagitis, without bleeding: Secondary | ICD-10-CM

## 2023-04-03 DIAGNOSIS — R7989 Other specified abnormal findings of blood chemistry: Secondary | ICD-10-CM

## 2023-04-03 DIAGNOSIS — R739 Hyperglycemia, unspecified: Secondary | ICD-10-CM | POA: Diagnosis not present

## 2023-04-03 DIAGNOSIS — G894 Chronic pain syndrome: Secondary | ICD-10-CM | POA: Diagnosis not present

## 2023-04-03 DIAGNOSIS — Z6831 Body mass index (BMI) 31.0-31.9, adult: Secondary | ICD-10-CM

## 2023-04-03 NOTE — Assessment & Plan Note (Signed)
Chronic, stable Continues on prilosec 20 mg daily

## 2023-04-03 NOTE — Assessment & Plan Note (Signed)
Chronic obesity with purposeful weight loss Body mass index is 31.43 kg/m. Continue to recommend balanced, lower carb meals. Smaller meal size, adding snacks. Choosing water as drink of choice and increasing purposeful exercise.

## 2023-04-03 NOTE — Assessment & Plan Note (Signed)
Elevated noted last Fall; pt without complaints BP stable Repeat labs

## 2023-04-03 NOTE — Progress Notes (Signed)
Established patient visit   Patient: Kayla English   DOB: 10-23-1960   62 y.o. Female  MRN: [id number] Visit Date: 04/03/2023  Today's healthcare provider: Jacky Kindle, FNP  Introduced to nurse practitioner role and practice setting.  All questions answered.  Discussed provider/patient relationship and expectations.  Subjective    HPI HPI     Annual Exam    Additional comments: No concerns today       Last edited by Rolly Salter, CMA on 04/03/2023  3:46 PM.      Medications: Outpatient Medications Prior to Visit  Medication Sig   acetaminophen (TYLENOL) 325 MG tablet Take by mouth. Reported on 03/04/2016   Calcium Carbonate-Vitamin D (CALCIUM-VITAMIN D) 500-200 MG-UNIT tablet Take by mouth.   DULoxetine (CYMBALTA) 30 MG capsule Take 3 capsules (90 mg total) by mouth daily.   estradiol (ESTRACE) 0.1 MG/GM vaginal cream Estrogen Cream Instruction Discard applicator Apply pea sized amount to tip of finger to urethra before bed. Wash hands well after application. Use Monday, Wednesday and Friday   ferrous sulfate 325 (65 FE) MG EC tablet Take 325 mg by mouth 3 (three) times daily with meals.   ibuprofen (ADVIL,MOTRIN) 400 MG tablet Take 400 mg by mouth every 6 (six) hours as needed.   Multiple Vitamin (MULTI-VITAMINS) TABS Take by mouth.   omeprazole (PRILOSEC) 20 MG capsule Take 1 capsule (20 mg total) by mouth daily.   Probiotic Product (PROBIOTIC & ACIDOPHILUS EX ST PO) Take by mouth.   SUMAtriptan (IMITREX) 100 MG tablet Take 100 mg by mouth every 2 (two) hours as needed for migraine. May repeat in 2 hours if headache persists or recurs.   topiramate (TOPAMAX) 100 MG tablet Take 200 mg by mouth 2 (two) times daily.   Vitamin D, Cholecalciferol, 1000 UNITS CAPS Take by mouth.   No facility-administered medications prior to visit.     Objective    BP 106/68 (BP Location: Left Arm, Patient Position: Sitting, Cuff Size: Large)   Pulse 87   Ht 5\' 4"  (1.626 m)   Wt  183 lb 1.6 oz (83.1 kg)   SpO2 99%   BMI 31.43 kg/m   Physical Exam Vitals and nursing note reviewed.  Constitutional:      General: She is not in acute distress.    Appearance: Normal appearance. She is obese. She is not ill-appearing, toxic-appearing or diaphoretic.  HENT:     Head: Normocephalic and atraumatic.  Cardiovascular:     Rate and Rhythm: Normal rate and regular rhythm.     Pulses: Normal pulses.     Heart sounds: Normal heart sounds. No murmur heard.    No friction rub. No gallop.  Pulmonary:     Effort: Pulmonary effort is normal. No respiratory distress.     Breath sounds: Normal breath sounds. No stridor. No wheezing, rhonchi or rales.  Chest:     Chest wall: No tenderness.  Musculoskeletal:        General: No swelling, tenderness, deformity or signs of injury. Normal range of motion.     Right lower leg: No edema.     Left lower leg: No edema.  Skin:    General: Skin is warm and dry.     Capillary Refill: Capillary refill takes less than 2 seconds.     Coloration: Skin is not jaundiced or pale.     Findings: No bruising, erythema, lesion or rash.  Neurological:     General: No focal  deficit present.     Mental Status: She is alert and oriented to person, place, and time. Mental status is at baseline.     Cranial Nerves: No cranial nerve deficit.     Sensory: No sensory deficit.     Motor: No weakness.     Coordination: Coordination normal.  Psychiatric:        Mood and Affect: Mood normal.        Behavior: Behavior normal.        Thought Content: Thought content normal.        Judgment: Judgment normal.     No results found for any visits on 04/03/23.  Assessment & Plan     Problem List Items Addressed This Visit       Digestive   Gastroesophageal reflux disease with esophagitis without hemorrhage    Chronic, stable Continues on prilosec 20 mg daily        Other   BMI 31.0-31.9,adult - Primary    Chronic obesity with purposeful weight  loss Body mass index is 31.43 kg/m. Continue to recommend balanced, lower carb meals. Smaller meal size, adding snacks. Choosing water as drink of choice and increasing purposeful exercise.       Chronic pain syndrome    Chronic, stable Remains on 90 mg cymbalta daily; stable per pt report       Relevant Orders   CBC with Differential/Platelet   Comprehensive Metabolic Panel (CMET)   TSH + free T4   Hemoglobin A1c   Lipid panel   Elevated serum creatinine    Elevated noted last Fall; pt without complaints BP stable Repeat labs       Relevant Orders   Comprehensive Metabolic Panel (CMET)   Elevated serum glucose    R/o DM; repeat A1c Continue to recommend balanced, lower carb meals. Smaller meal size, adding snacks. Choosing water as drink of choice and increasing purposeful exercise. Known PCOS- not on metformin       Relevant Orders   Hemoglobin A1c   Mixed hyperlipidemia    Chronic, untreated Repeat LP recommend diet low in saturated fat and regular exercise - 30 min at least 5 times per week The 10-year ASCVD risk score (Arnett DK, et al., 2019) is: 3%       Relevant Orders   Lipid panel   Return in about 3 months (around 06/21/2023) for annual examination.     Leilani Merl, FNP, have reviewed all documentation for this visit. The documentation on 04/03/23 for the exam, diagnosis, procedures, and orders are all accurate and complete.  Jacky Kindle, FNP  [facility] [phone] (phone) [phone] (fax)  [facility]

## 2023-04-03 NOTE — Assessment & Plan Note (Signed)
Chronic, stable Remains on 90 mg cymbalta daily; stable per pt report

## 2023-04-03 NOTE — Assessment & Plan Note (Signed)
Chronic, untreated Repeat LP recommend diet low in saturated fat and regular exercise - 30 min at least 5 times per week The 10-year ASCVD risk score (Arnett DK, et al., 2019) is: 3%

## 2023-04-03 NOTE — Assessment & Plan Note (Addendum)
R/o DM; repeat A1c Continue to recommend balanced, lower carb meals. Smaller meal size, adding snacks. Choosing water as drink of choice and increasing purposeful exercise. Known PCOS- not on metformin

## 2023-04-04 LAB — CBC WITH DIFFERENTIAL/PLATELET
Basophils Absolute: 0 10*3/uL (ref 0.0–0.2)
Basos: 1 %
EOS (ABSOLUTE): 0.1 10*3/uL (ref 0.0–0.4)
Eos: 1 %
Hematocrit: 41.7 % (ref 34.0–46.6)
Hemoglobin: 13.8 g/dL (ref 11.1–15.9)
Immature Grans (Abs): 0 10*3/uL (ref 0.0–0.1)
Immature Granulocytes: 0 %
Lymphocytes Absolute: 2.5 10*3/uL (ref 0.7–3.1)
Lymphs: 31 %
MCH: 29.7 pg (ref 26.6–33.0)
MCHC: 33.1 g/dL (ref 31.5–35.7)
MCV: 90 fL (ref 79–97)
Monocytes Absolute: 0.8 10*3/uL (ref 0.1–0.9)
Monocytes: 10 %
Neutrophils Absolute: 4.7 10*3/uL (ref 1.4–7.0)
Neutrophils: 57 %
Platelets: 283 10*3/uL (ref 150–450)
RBC: 4.65 x10E6/uL (ref 3.77–5.28)
RDW: 12.2 % (ref 11.7–15.4)
WBC: 8.2 10*3/uL (ref 3.4–10.8)

## 2023-04-04 LAB — COMPREHENSIVE METABOLIC PANEL
ALT: 12 IU/L (ref 0–32)
AST: 15 IU/L (ref 0–40)
Albumin: 4.2 g/dL (ref 3.9–4.9)
Alkaline Phosphatase: 70 IU/L (ref 44–121)
BUN/Creatinine Ratio: 16 (ref 12–28)
BUN: 16 mg/dL (ref 8–27)
Bilirubin Total: 0.4 mg/dL (ref 0.0–1.2)
CO2: 22 mmol/L (ref 20–29)
Calcium: 9.4 mg/dL (ref 8.7–10.3)
Chloride: 106 mmol/L (ref 96–106)
Creatinine, Ser: 0.99 mg/dL (ref 0.57–1.00)
Globulin, Total: 2.6 g/dL (ref 1.5–4.5)
Glucose: 88 mg/dL (ref 70–99)
Potassium: 4 mmol/L (ref 3.5–5.2)
Sodium: 141 mmol/L (ref 134–144)
Total Protein: 6.8 g/dL (ref 6.0–8.5)
eGFR: 64 mL/min/{1.73_m2} (ref >59)

## 2023-04-04 LAB — LIPID PANEL
Chol/HDL Ratio: 3.1 ratio (ref 0.0–4.4)
Cholesterol, Total: 203 mg/dL — ABNORMAL HIGH (ref 100–199)
HDL: 65 mg/dL (ref >39)
LDL Chol Calc (NIH): 118 mg/dL — ABNORMAL HIGH (ref 0–99)
Triglycerides: 113 mg/dL (ref 0–149)
VLDL Cholesterol Cal: 20 mg/dL (ref 5–40)

## 2023-04-04 LAB — HEMOGLOBIN A1C
Est. average glucose Bld gHb Est-mCnc: 111 mg/dL
Hgb A1c MFr Bld: 5.5 % (ref 4.8–5.6)

## 2023-04-04 LAB — TSH+FREE T4
Free T4: 0.85 ng/dL (ref 0.82–1.77)
TSH: 2.08 u[IU]/mL (ref 0.450–4.500)

## 2023-04-21 ENCOUNTER — Other Ambulatory Visit: Payer: Self-pay | Admitting: Family Medicine

## 2023-04-21 DIAGNOSIS — G894 Chronic pain syndrome: Secondary | ICD-10-CM

## 2023-04-21 DIAGNOSIS — K2100 Gastro-esophageal reflux disease with esophagitis, without bleeding: Secondary | ICD-10-CM

## 2023-06-27 ENCOUNTER — Encounter: Payer: Managed Care, Other (non HMO) | Admitting: Family Medicine

## 2023-07-08 ENCOUNTER — Ambulatory Visit (INDEPENDENT_AMBULATORY_CARE_PROVIDER_SITE_OTHER): Payer: Managed Care, Other (non HMO) | Admitting: Family Medicine

## 2023-07-08 ENCOUNTER — Encounter: Payer: Self-pay | Admitting: Family Medicine

## 2023-07-08 VITALS — BP 131/64 | HR 88 | Ht 64.0 in | Wt 179.0 lb

## 2023-07-08 DIAGNOSIS — R7303 Prediabetes: Secondary | ICD-10-CM | POA: Diagnosis not present

## 2023-07-08 DIAGNOSIS — F39 Unspecified mood [affective] disorder: Secondary | ICD-10-CM | POA: Diagnosis not present

## 2023-07-08 DIAGNOSIS — G4700 Insomnia, unspecified: Secondary | ICD-10-CM | POA: Diagnosis not present

## 2023-07-08 DIAGNOSIS — E782 Mixed hyperlipidemia: Secondary | ICD-10-CM | POA: Diagnosis not present

## 2023-07-08 DIAGNOSIS — Z0000 Encounter for general adult medical examination without abnormal findings: Secondary | ICD-10-CM

## 2023-07-08 NOTE — Progress Notes (Signed)
Complete physical exam   Patient: Kayla English   DOB: 1961-04-18   62 y.o. Female  MRN: [id number] Visit Date: 07/08/2023  Today's healthcare provider: Jacky Kindle, FNP  Re Introduced to nurse practitioner role and practice setting.  All questions answered.  Discussed provider/patient relationship and expectations.  Chief Complaint  Patient presents with   Annual Exam   Subjective    Kayla English is a 62 y.o. female who presents today for a complete physical exam.  She reports consuming a general diet.  Walks dogs for a living.  She generally feels fairly well. She reports sleeping poorly. She does have additional problems to discuss today.   HPI   The patient, with a history of prediabetes, presents with concerns about sleep disturbances, weight loss, and mood changes. The patient reports waking up in the middle of the night and sometimes having difficulty falling back asleep. The patient denies any specific stressors or worries that might be contributing to the sleep disturbances. The patient also reports a decrease in appetite and subsequent weight loss, but does not attribute this to any specific cause. The patient also mentions feeling less energetic and suspects that this might be due to the sleep issues.  The patient also mentions having done a home colon test, but the results have not been recorded by the insurance company. The patient's cholesterol levels and blood sugar levels have been stable, and the patient denies any new symptoms related to prediabetes. The patient is active, walking dogs for a living, and does not follow any specific diet.  Past Medical History:  Diagnosis Date   Avitaminosis D    Back pain    BRCA negative 10/2017   MyRisk neg; riskscore=6.4%/IBIS=9.6%   Breast nodule    Cerumen impaction    Depression    Family history of ovarian cancer    10/18 cancer genetic testing letter sent   Fatigue    Fibrocystic breast    Fibromyalgia     Headache    Insomnia    Irregular menses    Migraine    Osteoarthritis    Pelvic pain in female    Pelvic pain in female    Polycystic ovaries    Rash    Sinusitis    Snoring    TMJ arthritis    Transient adjustment reaction with anxiety    Trochanteric bursitis    Urinary frequency    UTI (lower urinary tract infection)    Weight gain    Past Surgical History:  Procedure Laterality Date   CESAREAN SECTION  1977   neck disc sugery  2012   SHOULDER SURGERY  2009   Social History   Socioeconomic History   Marital status: Married    Spouse name: Not on file   Number of children: Not on file   Years of education: Not on file   Highest education level: Bachelor's degree (e.g., BA, AB, BS)  Occupational History   Not on file  Tobacco Use   Smoking status: Never   Smokeless tobacco: Never  Vaping Use   Vaping status: Never Used  Substance and Sexual Activity   Alcohol use: No    Alcohol/week: 0.0 standard drinks of alcohol   Drug use: No   Sexual activity: Yes  Other Topics Concern   Not on file  Social History Narrative   Not on file   Social Determinants of Health   Financial Resource Strain: Low Risk  (  07/04/2023)   Overall Financial Resource Strain (CARDIA)    Difficulty of Paying Living Expenses: Not hard at all  Food Insecurity: No Food Insecurity (07/04/2023)   Hunger Vital Sign    Worried About Running Out of Food in the Last Year: Never true    Ran Out of Food in the Last Year: Never true  Transportation Needs: No Transportation Needs (07/04/2023)   PRAPARE - Administrator, Civil Service (Medical): No    Lack of Transportation (Non-Medical): No  Physical Activity: Insufficiently Active (07/04/2023)   Exercise Vital Sign    Days of Exercise per Week: 4 days    Minutes of Exercise per Session: 10 min  Stress: No Stress Concern Present (07/04/2023)   [employer] of Occupational Health - Occupational Stress Questionnaire    Feeling  of Stress : Only a little  Social Connections: Moderately Integrated (07/04/2023)   Social Connection and Isolation Panel [NHANES]    Frequency of Communication with Friends and Family: Once a week    Frequency of Social Gatherings with Friends and Family: Once a week    Attends Religious Services: More than 4 times per year    Active Member of [employer] or Organizations: Yes    Attends Engineer, structural: More than 4 times per year    Marital Status: Married  Catering manager Violence: Not on file   Family Status  Relation Name Status   MGF  (Not Specified)   PGM  (Not Specified)   PGF  (Not Specified)   Brother  (Not Specified)   Neg Hx  (Not Specified)  No partnership data on file   Family History  Problem Relation Age of Onset   Lung cancer Maternal Grandfather    Ovarian cancer Paternal Grandmother 55   Prostate cancer Paternal Grandfather    Hypertension Brother    Breast cancer Neg Hx    No Known Allergies  Patient Care Team: Jacky Kindle, FNP as PCP - General (Family Medicine)   Medications: Outpatient Medications Prior to Visit  Medication Sig   acetaminophen (TYLENOL) 325 MG tablet Take by mouth. Reported on 03/04/2016   Calcium Carbonate-Vitamin D (CALCIUM-VITAMIN D) 500-200 MG-UNIT tablet Take by mouth.   DULoxetine (CYMBALTA) 30 MG capsule TAKE 3 CAPSULES BY MOUTH DAILY   estradiol (ESTRACE) 0.1 MG/GM vaginal cream Estrogen Cream Instruction Discard applicator Apply pea sized amount to tip of finger to urethra before bed. Wash hands well after application. Use Monday, Wednesday and Friday   ferrous sulfate 325 (65 FE) MG EC tablet Take 325 mg by mouth 3 (three) times daily with meals.   ibuprofen (ADVIL,MOTRIN) 400 MG tablet Take 400 mg by mouth every 6 (six) hours as needed.   Multiple Vitamin (MULTI-VITAMINS) TABS Take by mouth.   omeprazole (PRILOSEC) 20 MG capsule TAKE 1 CAPSULE BY MOUTH DAILY   Probiotic Product (PROBIOTIC & ACIDOPHILUS EX ST PO)  Take by mouth.   SUMAtriptan (IMITREX) 100 MG tablet Take 100 mg by mouth every 2 (two) hours as needed for migraine. May repeat in 2 hours if headache persists or recurs.   Vitamin D, Cholecalciferol, 1000 UNITS CAPS Take by mouth.   [DISCONTINUED] topiramate (TOPAMAX) 100 MG tablet Take 200 mg by mouth 2 (two) times daily.   No facility-administered medications prior to visit.    Review of Systems Last CBC Lab Results  Component Value Date   WBC 8.2 04/03/2023   HGB 13.8 04/03/2023   HCT 41.7 04/03/2023  MCV 90 04/03/2023   MCH 29.7 04/03/2023   RDW 12.2 04/03/2023   PLT 283 04/03/2023   Last metabolic panel Lab Results  Component Value Date   GLUCOSE 88 04/03/2023   NA 141 04/03/2023   K 4.0 04/03/2023   CL 106 04/03/2023   CO2 22 04/03/2023   BUN 16 04/03/2023   CREATININE 0.99 04/03/2023   EGFR 64 04/03/2023   CALCIUM 9.4 04/03/2023   PROT 6.8 04/03/2023   ALBUMIN 4.2 04/03/2023   LABGLOB 2.6 04/03/2023   AGRATIO 1.7 06/19/2022   BILITOT 0.4 04/03/2023   ALKPHOS 70 04/03/2023   AST 15 04/03/2023   ALT 12 04/03/2023   Last lipids Lab Results  Component Value Date   CHOL 203 (H) 04/03/2023   HDL 65 04/03/2023   LDLCALC 118 (H) 04/03/2023   TRIG 113 04/03/2023   CHOLHDL 3.1 04/03/2023   Last hemoglobin A1c Lab Results  Component Value Date   HGBA1C 5.5 04/03/2023   Last thyroid functions Lab Results  Component Value Date   TSH 2.080 04/03/2023   Last vitamin D Lab Results  Component Value Date   VD25OH 82.0 06/19/2022   Last vitamin B12 and Folate No results found for: "VITAMINB12", "FOLATE"    Objective    BP 131/64 (BP Location: Right Arm, Patient Position: Sitting, Cuff Size: Normal)   Pulse 88   Ht 5\' 4"  (1.626 m)   Wt 179 lb (81.2 kg)   SpO2 97%   BMI 30.73 kg/m   Physical Exam Vitals and nursing note reviewed.  Constitutional:      General: She is awake. She is not in acute distress.    Appearance: Normal appearance. She is  well-developed and well-groomed. She is obese. She is not ill-appearing, toxic-appearing or diaphoretic.  HENT:     Head: Normocephalic and atraumatic.     Jaw: There is normal jaw occlusion. No trismus, tenderness, swelling or pain on movement.     Right Ear: Hearing, tympanic membrane, ear canal and external ear normal. There is no impacted cerumen.     Left Ear: Hearing, tympanic membrane, ear canal and external ear normal. There is no impacted cerumen.     Nose: Nose normal. No congestion or rhinorrhea.     Right Turbinates: Not enlarged, swollen or pale.     Left Turbinates: Not enlarged, swollen or pale.     Right Sinus: No maxillary sinus tenderness or frontal sinus tenderness.     Left Sinus: No maxillary sinus tenderness or frontal sinus tenderness.     Mouth/Throat:     Lips: Pink.     Mouth: Mucous membranes are moist. No injury.     Tongue: No lesions.     Pharynx: Oropharynx is clear. Uvula midline. No pharyngeal swelling, oropharyngeal exudate, posterior oropharyngeal erythema or uvula swelling.     Tonsils: No tonsillar exudate or tonsillar abscesses.  Eyes:     General: Lids are normal. Lids are everted, no foreign bodies appreciated. Vision grossly intact. Gaze aligned appropriately. No allergic shiner or visual field deficit.       Right eye: No discharge.        Left eye: No discharge.     Extraocular Movements: Extraocular movements intact.     Conjunctiva/sclera: Conjunctivae normal.     Right eye: Right conjunctiva is not injected. No exudate.    Left eye: Left conjunctiva is not injected. No exudate.    Pupils: Pupils are equal, round, and reactive to light.  Neck:     Thyroid: No thyroid mass, thyromegaly or thyroid tenderness.     Vascular: No carotid bruit.     Trachea: Trachea normal.  Cardiovascular:     Rate and Rhythm: Normal rate and regular rhythm.     Pulses: Normal pulses.          Carotid pulses are 2+ on the right side and 2+ on the left side.       Radial pulses are 2+ on the right side and 2+ on the left side.       Dorsalis pedis pulses are 2+ on the right side and 2+ on the left side.       Posterior tibial pulses are 2+ on the right side and 2+ on the left side.     Heart sounds: Normal heart sounds, S1 normal and S2 normal. No murmur heard.    No friction rub. No gallop.  Pulmonary:     Effort: Pulmonary effort is normal. No respiratory distress.     Breath sounds: Normal breath sounds and air entry. No stridor. No wheezing, rhonchi or rales.  Chest:     Chest wall: No tenderness.  Abdominal:     General: Abdomen is flat. Bowel sounds are normal. There is no distension.     Palpations: Abdomen is soft. There is no mass.     Tenderness: There is no abdominal tenderness. There is no right CVA tenderness, left CVA tenderness, guarding or rebound.     Hernia: No hernia is present.  Genitourinary:    Comments: Exam deferred; denies complaints Musculoskeletal:        General: No swelling, tenderness, deformity or signs of injury. Normal range of motion.     Cervical back: Full passive range of motion without pain, normal range of motion and neck supple. No edema, rigidity or tenderness. No muscular tenderness.     Right lower leg: No edema.     Left lower leg: No edema.  Lymphadenopathy:     Cervical: No cervical adenopathy.     Right cervical: No superficial, deep or posterior cervical adenopathy.    Left cervical: No superficial, deep or posterior cervical adenopathy.  Skin:    General: Skin is warm and dry.     Capillary Refill: Capillary refill takes less than 2 seconds.     Coloration: Skin is not jaundiced or pale.     Findings: No bruising, erythema, lesion or rash.  Neurological:     General: No focal deficit present.     Mental Status: She is alert and oriented to person, place, and time. Mental status is at baseline.     GCS: GCS eye subscore is 4. GCS verbal subscore is 5. GCS motor subscore is 6.     Sensory:  Sensation is intact. No sensory deficit.     Motor: Motor function is intact. No weakness.     Coordination: Coordination is intact. Coordination normal.     Gait: Gait is intact. Gait normal.  Psychiatric:        Attention and Perception: Attention and perception normal.        Mood and Affect: Mood and affect normal.        Speech: Speech normal.        Behavior: Behavior normal. Behavior is cooperative.        Thought Content: Thought content normal.        Cognition and Memory: Cognition and memory normal.  Judgment: Judgment normal.      Last depression screening scores    07/08/2023    3:25 PM 06/19/2022    8:48 AM 04/24/2021    1:23 PM  PHQ 2/9 Scores  PHQ - 2 Score 1 0 0  PHQ- 9 Score 6 5 2    Last fall risk screening    07/08/2023    3:25 PM  Fall Risk   Falls in the past year? 0  Number falls in past yr: 0  Injury with Fall? 0   Last Audit-C alcohol use screening    07/04/2023    9:54 AM  Alcohol Use Disorder Test (AUDIT)  1. How often do you have a drink containing alcohol? 0  3. How often do you have six or more drinks on one occasion? 0   A score of 3 or more in women, and 4 or more in men indicates increased risk for alcohol abuse, EXCEPT if all of the points are from question 1   No results found for any visits on 07/08/23.  Assessment & Plan    Routine Health Maintenance and Physical Exam  Exercise Activities and Dietary recommendations  Goals   None     Immunization History  Administered Date(s) Administered   Influenza Split 07/10/2011, 06/24/2012   Influenza,inj,Quad PF,6+ Mos 07/30/2013, 05/12/2014, 06/10/2016, 05/13/2017, 06/15/2018, 04/21/2020   Influenza-Unspecified 06/03/2023   Moderna Sars-Covid-2 Vaccination 10/01/2019, 10/29/2019, 07/04/2020   PFIZER(Purple Top)SARS-COV-2 Vaccination 01/20/2021   Pfizer Covid-19 Vaccine Bivalent Booster 57yrs & up 06/03/2023   Td 06/01/2003   Tdap 11/08/2009    Health Maintenance  Topic  Date Due   Zoster Vaccines- Shingrix (1 of 2) Never done   DTaP/Tdap/Td (3 - Td or Tdap) 11/09/2019   Fecal DNA (Cologuard)  06/23/2023   COVID-19 Vaccine (6 - 2023-24 season) 07/29/2023   MAMMOGRAM  01/06/2025   Cervical Cancer Screening (HPV/Pap Cotest)  04/21/2025   INFLUENZA VACCINE  Completed   Hepatitis C Screening  Completed   HIV Screening  Completed   HPV VACCINES  Aged Out   Colonoscopy  Discontinued    Discussed health benefits of physical activity, and encouraged her to engage in regular exercise appropriate for her age and condition.  Problem List Items Addressed This Visit   None Insomnia//Mood disorder, elevated PHQ9 Reports inconsistent sleep patterns with frequent awakenings. No clear precipitating factors identified. No signs of sleep apnea. Discussed potential age-related changes and hormonal influences. -Consider balanced snack before bedtime for potential blood sugar stabilization. -Consider increasing physical activity to include 30 minutes of vigorous exercise.  Weight Loss Unintentional weight loss of approximately 15 pounds over the past year. No specific diet followed. Reports decreased appetite. No signs of malnutrition or concerning rapid weight loss. -Monitor weight and appetite. Return for evaluation if further weight loss or other concerning symptoms develop.  Colon Cancer Screening Completed a home colon cancer screening test, but results not recorded with insurance. No abnormal results reported. -Check status of colon cancer screening results.  Hyperlipidemia Stable with total cholesterol just over 200 and LDL at 118. -Consider rechecking cholesterol in April 2025.  Prediabetes Stable with HbA1c of 5.5 for the past two years. -Continue annual monitoring of HbA1c.  General Health Maintenance -Reminder for shingles vaccine. -Eligible for COVID-19 booster in December 2024.   Leilani Merl, FNP, have reviewed all documentation for this  visit. The documentation on 07/08/23 for the exam, diagnosis, procedures, and orders are all accurate and complete.  Daryl Eastern  Suzie Portela, FNP  [facility] [phone] (phone) [phone] (fax)  [facility]

## 2023-07-13 NOTE — Patient Instructions (Signed)
Jacky Kindle, FNP  [facility] [address] [place], [place] [address] [phone] (phone) [phone] (fax) [facility]

## 2023-11-05 ENCOUNTER — Ambulatory Visit: Payer: Managed Care, Other (non HMO) | Admitting: Family Medicine

## 2024-02-24 ENCOUNTER — Telehealth: Payer: Self-pay | Admitting: Family Medicine

## 2024-02-24 DIAGNOSIS — K2100 Gastro-esophageal reflux disease with esophagitis, without bleeding: Secondary | ICD-10-CM

## 2024-02-24 MED ORDER — OMEPRAZOLE 20 MG PO CPDR
20.0000 mg | DELAYED_RELEASE_CAPSULE | Freq: Every day | ORAL | 3 refills | Status: DC
Start: 2024-02-24 — End: 2024-05-04

## 2024-02-24 NOTE — Telephone Encounter (Signed)
[facility] faxed refill request for the following medications:   omeprazole (PRILOSEC) 20 MG capsule    Please advise.

## 2024-03-25 ENCOUNTER — Other Ambulatory Visit: Payer: Self-pay | Admitting: Family Medicine

## 2024-03-25 DIAGNOSIS — G894 Chronic pain syndrome: Secondary | ICD-10-CM

## 2024-03-25 MED ORDER — DULOXETINE HCL 30 MG PO CPEP
90.0000 mg | ORAL_CAPSULE | Freq: Every day | ORAL | 0 refills | Status: DC
Start: 2024-03-25 — End: 2024-05-07

## 2024-03-25 NOTE — Telephone Encounter (Signed)
 [facility] faxed refill request for the following medications:   DULoxetine  (CYMBALTA ) 30 MG capsule    Please advise

## 2024-03-25 NOTE — Telephone Encounter (Signed)
Converted to refill request 

## 2024-04-08 ENCOUNTER — Ambulatory Visit

## 2024-04-08 VITALS — BP 131/71 | HR 98 | Ht 61.0 in | Wt 201.0 lb

## 2024-04-08 DIAGNOSIS — M797 Fibromyalgia: Secondary | ICD-10-CM

## 2024-04-08 DIAGNOSIS — F419 Anxiety disorder, unspecified: Secondary | ICD-10-CM | POA: Diagnosis not present

## 2024-04-08 DIAGNOSIS — Z1211 Encounter for screening for malignant neoplasm of colon: Secondary | ICD-10-CM

## 2024-04-08 DIAGNOSIS — Z23 Encounter for immunization: Secondary | ICD-10-CM | POA: Diagnosis not present

## 2024-04-08 MED ORDER — HYDROXYZINE PAMOATE 25 MG PO CAPS
25.0000 mg | ORAL_CAPSULE | Freq: Three times a day (TID) | ORAL | 0 refills | Status: AC | PRN
Start: 2024-04-08 — End: ?

## 2024-04-08 NOTE — Progress Notes (Signed)
 Established patient visit  Patient: Kayla English   DOB: 08/18/1961   63 y.o. Female  MRN: [id number] Visit Date: 04/08/2024  Today's healthcare provider: Isaiah DELENA Pepper, MD   Chief Complaint  Patient presents with   Transitions Of Care    Discuss medication,     Subjective    HPI:  Anxiety: - taking Cymbalta  90mg  daily - feels weird around dinner time - reports heightened anxiety at night and difficulty sleeping - cymbalta  has been helping with fibromyalgia - denies SI   Medications: Outpatient Medications Prior to Visit  Medication Sig   acetaminophen (TYLENOL) 325 MG tablet Take by mouth. Reported on 03/04/2016   Calcium Carbonate-Vitamin D  (CALCIUM-VITAMIN D ) 500-200 MG-UNIT tablet Take by mouth.   DULoxetine  (CYMBALTA ) 30 MG capsule Take 3 capsules (90 mg total) by mouth daily.   estradiol  (ESTRACE ) 0.1 MG/GM vaginal cream Estrogen Cream Instruction Discard applicator Apply pea sized amount to tip of finger to urethra before bed. Wash hands well after application. Use Monday, Wednesday and Friday   ibuprofen (ADVIL,MOTRIN) 400 MG tablet Take 400 mg by mouth every 6 (six) hours as needed.   Multiple Vitamin (MULTI-VITAMINS) TABS Take by mouth.   omeprazole  (PRILOSEC) 20 MG capsule Take 1 capsule (20 mg total) by mouth daily. TAKE 1 CAPSULE BY MOUTH DAILY   Probiotic Product (PROBIOTIC & ACIDOPHILUS EX ST PO) Take by mouth.   SUMAtriptan (IMITREX) 100 MG tablet Take 100 mg by mouth every 2 (two) hours as needed for migraine. May repeat in 2 hours if headache persists or recurs.   Vitamin D , Cholecalciferol, 1000 UNITS CAPS Take by mouth.   [DISCONTINUED] ferrous sulfate 325 (65 FE) MG EC tablet Take 325 mg by mouth 3 (three) times daily with meals.   No facility-administered medications prior to visit.    Review of Systems as noted in HPI.      Objective    BP 131/71   Pulse 98   Ht 5' 1 (1.549 m)   Wt 201 lb (91.2 kg)   SpO2 99%   BMI 37.98 kg/m     Physical Exam Constitutional:      Appearance: Normal appearance.  HENT:     Head: Normocephalic and atraumatic.     Mouth/Throat:     Mouth: Mucous membranes are moist.  Eyes:     Pupils: Pupils are equal, round, and reactive to light.  Pulmonary:     Effort: Pulmonary effort is normal.  Skin:    General: Skin is warm.  Neurological:     General: No focal deficit present.     Mental Status: She is alert.      No results found for any visits on 04/08/24.  Assessment & Plan     Problem List Items Addressed This Visit       Other   Anxiety - Primary   Chronic, currently uncontrolled. Cymbalta  previously helped with mood symptoms, however reports feeling more anxious at night and has difficulty falling asleep. Has tried melatonin for sleep without much improvement. - Recommend starting Atarax  25mg  PRN for anxiety/sleep - Would not recommend increasing Cymbalta  given minimal additional benefit for doses > 60mg  - Discussed medication side effects - Follow up in 1 month if no improvement      Relevant Medications   hydrOXYzine  (VISTARIL ) 25 MG capsule   Fibromyalgia   Chronic, well controlled at this time with Cymbalta  90mg . - Continue Cymbalta  90mg       Other Visit  Diagnoses       Need for vaccination       Relevant Orders   Tdap vaccine greater than or equal to 7yo IM   Zoster Recombinant (Shingrix  )   Pneumococcal conjugate vaccine 20-valent (Prevnar 20)     Screening for colon cancer       Relevant Orders   Cologuard      Return in about 1 year (around 04/08/2025) for Annual.      Isaiah DELENA Pepper, MD  [facility] [phone] (phone) [phone] (fax)

## 2024-04-08 NOTE — Assessment & Plan Note (Addendum)
 Chronic, currently uncontrolled. Cymbalta  previously helped with mood symptoms, however reports feeling more anxious at night and has difficulty falling asleep. Has tried melatonin for sleep without much improvement. - Recommend starting Atarax  25mg  PRN for anxiety/sleep - Would not recommend increasing Cymbalta  given minimal additional benefit for doses > 60mg  - Discussed medication side effects - Follow up in 1 month if no improvement

## 2024-04-08 NOTE — Assessment & Plan Note (Signed)
 Chronic, well controlled at this time with Cymbalta  90mg . - Continue Cymbalta  90mg 

## 2024-04-22 LAB — COLOGUARD: COLOGUARD: NEGATIVE

## 2024-04-23 ENCOUNTER — Ambulatory Visit: Payer: Self-pay

## 2024-05-03 ENCOUNTER — Other Ambulatory Visit: Payer: Self-pay | Admitting: Family Medicine

## 2024-05-03 DIAGNOSIS — K2100 Gastro-esophageal reflux disease with esophagitis, without bleeding: Secondary | ICD-10-CM

## 2024-05-06 ENCOUNTER — Other Ambulatory Visit: Payer: Self-pay | Admitting: Family Medicine

## 2024-05-06 DIAGNOSIS — G894 Chronic pain syndrome: Secondary | ICD-10-CM

## 2025-04-12 ENCOUNTER — Encounter
